# Patient Record
Sex: Female | Born: 1955 | Race: White | Hispanic: No | Marital: Married | Smoking: Never smoker
Health system: Southern US, Community
[De-identification: ages and names within clinical notes are randomized; demographics above are authoritative.]

## PROBLEM LIST (undated history)

## (undated) DIAGNOSIS — E039 Hypothyroidism, unspecified: Secondary | ICD-10-CM

## (undated) DIAGNOSIS — M1990 Unspecified osteoarthritis, unspecified site: Secondary | ICD-10-CM

## (undated) DIAGNOSIS — R011 Cardiac murmur, unspecified: Secondary | ICD-10-CM

## (undated) DIAGNOSIS — I10 Essential (primary) hypertension: Secondary | ICD-10-CM

## (undated) HISTORY — PX: TONSILLECTOMY: SUR1361

## (undated) HISTORY — PX: APPENDECTOMY: SHX54

---

## 2002-12-30 ENCOUNTER — Ambulatory Visit (HOSPITAL_COMMUNITY): Admission: RE | Admit: 2002-12-30 | Discharge: 2002-12-31 | Payer: Self-pay | Admitting: Specialist

## 2002-12-30 ENCOUNTER — Encounter: Payer: Self-pay | Admitting: Specialist

## 2004-03-18 HISTORY — PX: BACK SURGERY: SHX140

## 2009-05-04 ENCOUNTER — Ambulatory Visit: Payer: Self-pay | Admitting: Diagnostic Radiology

## 2009-05-04 ENCOUNTER — Emergency Department (HOSPITAL_BASED_OUTPATIENT_CLINIC_OR_DEPARTMENT_OTHER): Admission: EM | Admit: 2009-05-04 | Discharge: 2009-05-04 | Payer: Self-pay | Admitting: Emergency Medicine

## 2010-04-08 ENCOUNTER — Encounter: Payer: Self-pay | Admitting: Orthopedic Surgery

## 2010-04-09 ENCOUNTER — Encounter: Payer: Self-pay | Admitting: Family Medicine

## 2010-06-07 LAB — CBC
HCT: 34.9 % — ABNORMAL LOW (ref 36.0–46.0)
Hemoglobin: 11.5 g/dL — ABNORMAL LOW (ref 12.0–15.0)
MCHC: 33.1 g/dL (ref 30.0–36.0)
MCV: 83.5 fL (ref 78.0–100.0)
Platelets: 231 10*3/uL (ref 150–400)
RBC: 4.17 MIL/uL (ref 3.87–5.11)
RDW: 15 % (ref 11.5–15.5)
WBC: 7 10*3/uL (ref 4.0–10.5)

## 2010-06-07 LAB — POCT CARDIAC MARKERS
CKMB, poc: <1 ng/mL — ABNORMAL LOW (ref 1.0–8.0)
Troponin i, poc: <0.05 ng/mL (ref 0.00–0.09)

## 2010-06-07 LAB — BASIC METABOLIC PANEL
BUN: 14 mg/dL (ref 6–23)
CO2: 28 mEq/L (ref 19–32)
Calcium: 8.8 mg/dL (ref 8.4–10.5)
Chloride: 107 mEq/L (ref 96–112)
Creatinine, Ser: 0.6 mg/dL (ref 0.4–1.2)
GFR calc Af Amer: >60 mL/min (ref >60)
GFR calc non Af Amer: >60 mL/min (ref >60)
Glucose, Bld: 86 mg/dL (ref 70–99)
Potassium: 3.6 mEq/L (ref 3.5–5.1)
Sodium: 140 mEq/L (ref 135–145)

## 2010-06-07 LAB — DIFFERENTIAL
Basophils Absolute: 0.1 10*3/uL (ref 0.0–0.1)
Lymphocytes Relative: 30 % (ref 12–46)
Lymphs Abs: 2.1 10*3/uL (ref 0.7–4.0)
Monocytes Absolute: 0.5 10*3/uL (ref 0.1–1.0)
Neutro Abs: 4 10*3/uL (ref 1.7–7.7)

## 2010-08-03 NOTE — Op Note (Signed)
NAME:  Kaitlyn Melton, Kaitlyn Melton                      ACCOUNT NO.:  [id number]   MEDICAL RECORD NO.:  [id number]                   PATIENT TYPE:  OIB   LOCATION:  5004                                 FACILITY:  [facility]   PHYSICIAN:  Jene Every, M.D.                 DATE OF BIRTH:  06/15/1955   DATE OF PROCEDURE:  12/30/2002  DATE OF DISCHARGE:                                 OPERATIVE REPORT   PREOPERATIVE DIAGNOSIS:  Lateral recess stenosis, herniated nucleus  pulposis, L4-L5 right.   POSTOPERATIVE DIAGNOSIS:  Lateral recess stenosis, herniated nucleus  pulposis, L4-L5 right.   PROCEDURE PERFORMED:  Lateral recess decompression and foraminotomy L5,  microdiscectomy L4-L5.   ANESTHESIA:  General.   ASSISTANT:  Genene Churn. Barry Dienes, P.A.   BRIEF HISTORY AND INDICATIONS:  This is a 55 year old with refractory L5  radicular pain, foot drop, decreased sensation at the L5 dermatome.  MRI  indicated extruded fragment compressing the L5 nerve root.  Operative  intervention was indication for decompression of the L5 nerve root by  microdiscectomy, lateral recess stenosis.  The risks and benefits were  discussed including bleeding, infection, CSF leak, epidural fibrosis, etc.   SURGICAL TECHNIQUE:  With the patient in the supine position, after adequate  general anesthesia and 1 gram Kefzol, she was placed prone on the Hi-Nella  frame.  All bony prominences were well padded.  The lumbar region was  prepped and draped in the usual sterile fashion.  Two 18 gauge spinal  needles were utilized to localize the L4-L5 interspace confirmed with x-ray  and incision was made from the spinous process of 4 to 5.  The subcutaneous  tissue were dissected, electrocautery was utilized to achieve hemostasis.  The dorsal lumbar fascia was identified and divided in line with the skin  incision.  The paraspinous muscle was elevated from 4 and 5.  A Scoville  retractor was placed after a confirmatory radiograph was  obtained.  The  operating microscope was draped and brought into the surgical field.  The  ligamentum flavum was detached from the cephalad edge of L5 and the caudal  edge of L4.  Hemilaminotomy was performed of the caudad edge of 4 and  cephalad edge of 5.  There was significant lateral recess stenosis noted and  ligamentum flavum hypertrophy.  Protecting the neural elements, we  decompressed the lateral recess with a 2 and 3 mm Kerrisons and performed a  foraminotomy of L5, identified the L5 nerve root, gently mobilized it  medially.  A large extruded fragment was noted just at the level of the L5  pedicle medially.  It was hypervascular and bipolar electrocautery was  utilized to achieve hemostasis.  We mobilized a herniated disc and removed  it in one large fragment.  I placed a hockey stick probe beneath the thecal  sac in the foramen of 5 and 4 and found it widely patent with no  residual  disc herniation.  I checked at the disc space, too, there was no evidence of  disc herniation noted at this area.  There was good excursion of the 5 root.  Bipolar cautery was utilized to achieve hemostasis.  The hemilaminotomy was  copiously irrigated with antibiotic irrigation and placed thrombin soaked  Gelfoam.  There was no CSF leakage or active bleeding.  Removing the  Scoville retractor, the paraspinous muscles were inspected with no evidence  of active bleeding.  The dorsal lumbar fascia was reapproximated with 0  Vicryl interrupted figure-of-eight sutures, the subcutaneous tissues were  reapproximated with 2-0 Vicryl simple sutures, the  skin was reapproximated with 4-0 subcuticular Prolene.  The wounds were  reinforced with Steri-Strips.  A sterile dressing was applied.  She was  placed supine and extubated without difficulty and transported to the  recovery room in satisfactory condition.  The patient tolerated the  procedure well with no complications.                                                Jene Every, M.D.    Cordelia Pen  D:  12/30/2002  T:  12/30/2002  Job:  [id number]

## 2010-11-16 IMAGING — CR DG CHEST 2V
2 series · 2 of 2 positions shown · non-contrast
Comparison: None.

CLINICAL DATA: Left chest pain.  Left arm tingling sensation.
Hypertension.

CHEST - 2 VIEW

[w chest pa]
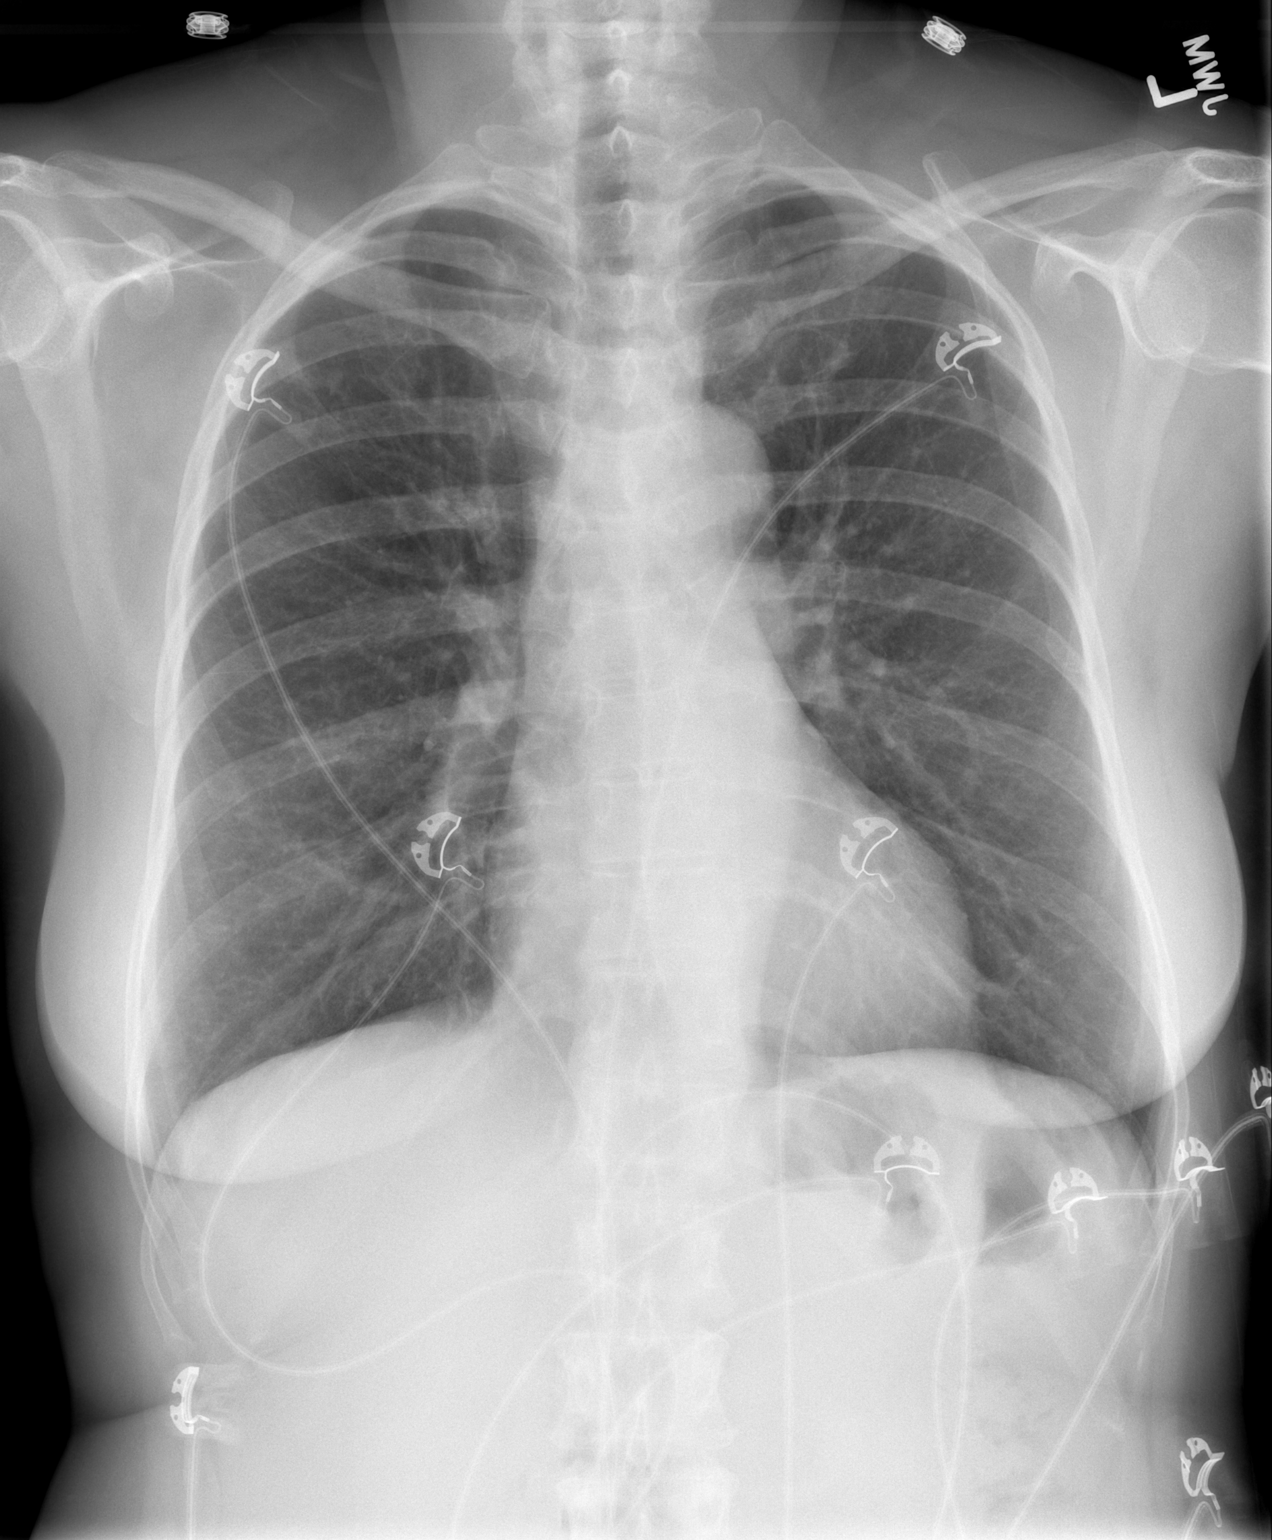

[w chest lat]
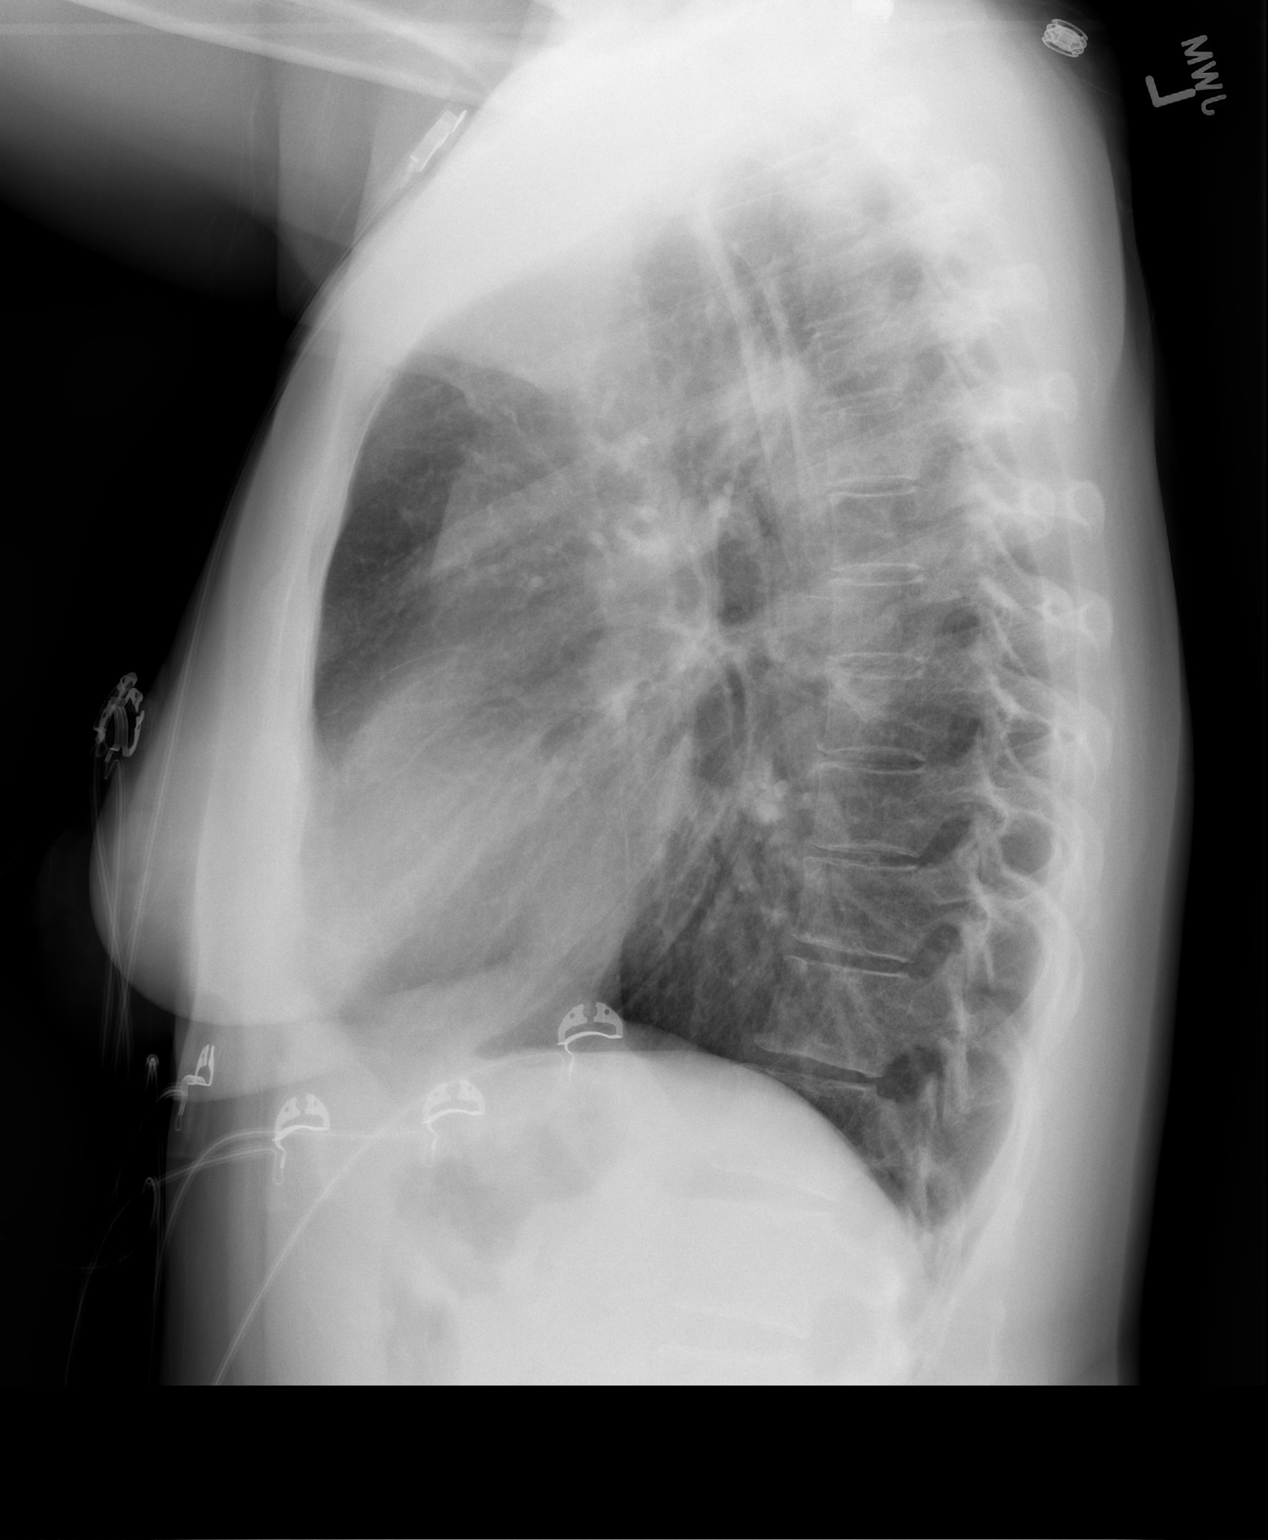

[2 of 2 positions shown; findings below may reference images not displayed]

FINDINGS: Minimal interstitial prominence the lungs may be chronic.
No airspace opacity is identified.  There is borderline airway
thickening which may be manifestation of bronchitis or reactive
airways disease.

Cardiac and mediastinal contours appear unremarkable.
IMPRESSION: 1.  Borderline airway thickening, potentially manifestation of
bronchitis or reactive airways disease.
2.  Mild prominence of the interstitium, of uncertain chronicity.

## 2012-12-16 HISTORY — PX: CARDIAC VALVE SURGERY: SHX40

## 2013-04-30 ENCOUNTER — Encounter: Payer: Self-pay | Admitting: Internal Medicine

## 2013-07-01 ENCOUNTER — Encounter: Payer: Self-pay | Admitting: Internal Medicine

## 2015-09-14 NOTE — H&P (Signed)
TOTAL KNEE ADMISSION H&P  Patient is being admitted for left total knee arthroplasty.  Subjective:  Chief Complaint:   Left knee primary OA / pain  HPI: Kaitlyn Melton, 60 y.o. female, has a history of pain and functional disability in the left knee due to arthritis and has failed non-surgical conservative treatments for greater than 12 weeks to include NSAID's and/or analgesics, corticosteriod injections, viscosupplementation injections and activity modification.  Onset of symptoms was gradual, starting 3+ years ago with gradually worsening course since that time. The patient noted no past surgery on the left knee(s).  Patient currently rates pain in the left knee(s) at 10 out of 10 with activity. Patient has night pain, worsening of pain with activity and weight bearing, pain that interferes with activities of daily living, pain with passive range of motion, crepitus and joint swelling.  Patient has evidence of periarticular osteophytes and joint space narrowing by imaging studies. There is no active infection.  Risks, benefits and expectations were discussed with the patient.  Risks including but not limited to the risk of anesthesia, blood clots, nerve damage, blood vessel damage, failure of the prosthesis, infection and up to and including death.  Patient understand the risks, benefits and expectations and wishes to proceed with surgery.   PCP: Teena IraniSPENCER,SARA C, PA-C  D/C Plans:      Home with HHPT  Post-op Meds:       No Rx given  Tranexamic Acid:      To be given - IV   Decadron:      Is to be given  FYI:     ASA  Norco     Past Medical History  Diagnosis Date  . Hypertension   . Arthritis   . Heart murmur   . Hypothyroidism     Past Surgical History  Procedure Laterality Date  . 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 Tonsillectomy      age 89  . Appendectomy      age 60  . Back surgery  2006    lumbar surgery    No prescriptions prior to  admission   No Known Allergies   Social History  Substance Use Topics  . Smoking status: Never Smoker   . Smokeless tobacco: Not on file  . Alcohol Use: No       Review of Systems  Constitutional: Negative.   HENT: Negative.   Eyes: Negative.   Respiratory: Negative.   Cardiovascular: Negative.   Gastrointestinal: Negative.   Genitourinary: Negative.   Musculoskeletal: Positive for joint pain.  Skin: Negative.   Neurological: Negative.   Endo/Heme/Allergies: Negative.   Psychiatric/Behavioral: Negative.     Objective:  Physical Exam  Constitutional: She is oriented to person, place, and time. She appears well-developed.  HENT:  Head: Normocephalic.  Eyes: Pupils are equal, round, and reactive to light.  Neck: Neck supple. No JVD present. No tracheal deviation present. No thyromegaly present.  Cardiovascular: Normal rate, regular rhythm and intact distal pulses.   Murmur heard. Respiratory: Effort normal and breath sounds normal. No stridor. No respiratory distress. She has no wheezes.  GI: Soft. There is no tenderness. There is no guarding.  Musculoskeletal:       Left knee: She exhibits decreased range of motion, swelling and bony tenderness. She exhibits no ecchymosis, no deformity, no laceration and no erythema. Tenderness found.  Lymphadenopathy:    She has no cervical adenopathy.  Neurological: She is alert  and oriented to person, place, and time.  Skin: Skin is warm and dry.  Psychiatric: She has a normal mood and affect.      Imaging Review Plain radiographs demonstrate severe degenerative joint disease of the left knee(s).  The bone quality appears to be good for age and reported activity level.  Assessment/Plan:  End stage arthritis, left knee   The patient history, physical examination, clinical judgment of the provider and imaging studies are consistent with end stage degenerative joint disease of the left knee(s) and total knee arthroplasty is  deemed medically necessary. The treatment options including medical management, injection therapy arthroscopy and arthroplasty were discussed at length. The risks and benefits of total knee arthroplasty were presented and reviewed. The risks due to aseptic loosening, infection, stiffness, patella tracking problems, thromboembolic complications and other imponderables were discussed. The patient acknowledged the explanation, agreed to proceed with the plan and consent was signed. Patient is being admitted for inpatient treatment for surgery, pain control, PT, OT, prophylactic antibiotics, VTE prophylaxis, progressive ambulation and ADL's and discharge planning. The patient is planning to be discharged home with home health services.     Anastasio AuerbachMatthew S. Costella Schwarz   PA-C  09/24/2015, 9:43 AM

## 2015-09-20 ENCOUNTER — Encounter (HOSPITAL_COMMUNITY): Payer: Self-pay

## 2015-09-20 ENCOUNTER — Encounter (HOSPITAL_COMMUNITY)
Admission: RE | Admit: 2015-09-20 | Discharge: 2015-09-20 | Disposition: A | Discharge status: Home / Self Care | Payer: BLUE CROSS/BLUE SHIELD | Source: Ambulatory Visit | Attending: Orthopedic Surgery | Admitting: Orthopedic Surgery

## 2015-09-20 DIAGNOSIS — Z01812 Encounter for preprocedural laboratory examination: Secondary | ICD-10-CM | POA: Diagnosis not present

## 2015-09-20 DIAGNOSIS — M25562 Pain in left knee: Secondary | ICD-10-CM | POA: Diagnosis not present

## 2015-09-20 DIAGNOSIS — M1712 Unilateral primary osteoarthritis, left knee: Secondary | ICD-10-CM | POA: Diagnosis not present

## 2015-09-20 HISTORY — DX: Cardiac murmur, unspecified: R01.1

## 2015-09-20 HISTORY — DX: Unspecified osteoarthritis, unspecified site: M19.90

## 2015-09-20 HISTORY — DX: Essential (primary) hypertension: I10

## 2015-09-20 HISTORY — DX: Hypothyroidism, unspecified: E03.9

## 2015-09-20 LAB — CBC
HEMATOCRIT: 40.4 % (ref 36.0–46.0)
HEMOGLOBIN: 13.6 g/dL (ref 12.0–15.0)
MCH: 30 pg (ref 26.0–34.0)
MCHC: 33.7 g/dL (ref 30.0–36.0)
MCV: 89.2 fL (ref 78.0–100.0)
Platelets: 183 10*3/uL (ref 150–400)
RBC: 4.53 MIL/uL (ref 3.87–5.11)
RDW: 13.5 % (ref 11.5–15.5)
WBC: 6.4 10*3/uL (ref 4.0–10.5)

## 2015-09-20 LAB — BASIC METABOLIC PANEL
ANION GAP: 5 (ref 5–15)
BUN: 21 mg/dL — ABNORMAL HIGH (ref 6–20)
CHLORIDE: 107 mmol/L (ref 101–111)
CO2: 24 mmol/L (ref 22–32)
Calcium: 9 mg/dL (ref 8.9–10.3)
Creatinine, Ser: 0.74 mg/dL (ref 0.44–1.00)
Glucose, Bld: 92 mg/dL (ref 65–99)
POTASSIUM: 4.4 mmol/L (ref 3.5–5.1)
SODIUM: 136 mmol/L (ref 135–145)

## 2015-09-20 LAB — SURGICAL PCR SCREEN
MRSA, PCR: NEGATIVE
Staphylococcus aureus: NEGATIVE

## 2015-09-20 LAB — ABO/RH: ABO/RH(D): O POS

## 2015-09-20 NOTE — Patient Instructions (Signed)
Leonette NuttingFrances E Gelinas  09/20/2015   Your procedure is scheduled on: Monday 09/25/2015  [facility]  [place]vators to 3rd floor to  Short Stay Center at  0730 AM.  Call this number if you have problems the morning of surgery [phone]   Remember: ONLY 1 PERSON MAY GO WITH YOU TO SHORT STAY TO GET  READY MORNING OF YOUR SURGERY.   Do not eat food or drink liquids :After Midnight.     Take these medicines the morning of surgery with A SIP OF WATER: Metoprolol succinate, Levothyroxine                                 You may not have any metal on your body including hair pins and              piercings  Do not wear jewelry, make-up, lotions, powders or perfumes, deodorant             Do not wear nail polish.  Do not shave  48 hours prior to surgery.              Men may shave face and neck.   Do not bring valuables to the hospital. [facility] IS NOT             RESPONSIBLE   FOR VALUABLES.  Contacts, dentures or bridgework may not be worn into surgery.  Leave suitcase in the car. After surgery it may be brought to your room.                  Please read over the following fact sheets you were given: _____________________________________________________________________             [facility] - Pre[facility] Before surgery, you can play an important role.  Because skin is not sterile, your skin needs to be as free of germs as possible.  You can reduce the number of germs on your skin by washing with CHG (chlorahexidine gluconate) soap before surgery.  CHG is an antiseptic cleaner which kills germs and bonds with the skin to continue killing germs even after washing. Please DO NOT use if you have an allergy to CHG or antibacterial soaps.  If your skin becomes reddened/irritated stop using the CHG and inform your nurse when you arrive at Short Stay. Do not shave (including legs and underarms) for at least 48 hours prior to  the first CHG shower.  You may shave your face/neck. Please follow these instructions carefully:  1.  Shower with CHG Soap the night before surgery and the  morning of Surgery.  2.  If you choose to wash your hair, wash your hair first as usual with your  normal  shampoo.  3.  After you shampoo, rinse your hair and body thoroughly to remove the  shampoo.                           4.  Use CHG as you would any other liquid soap.  You can apply chg directly  to the skin and wash                       Gently with a scrungie or clean washcloth.  5.  Apply the CHG Soap to your body ONLY FROM THE NECK DOWN.   Do not use on face/ open                           Wound or open sores. Avoid contact with eyes, ears mouth and genitals (private parts).                       Wash face,  Genitals (private parts) with your normal soap.             6.  Wash thoroughly, paying special attention to the area where your surgery  will be performed.  7.  Thoroughly rinse your body with warm water from the neck down.  8.  DO NOT shower/wash with your normal soap after using and rinsing off  the CHG Soap.                9.  Pat yourself dry with a clean towel.            10.  Wear clean pajamas.            11.  Place clean sheets on your bed the night of your first shower and do not  sleep with pets. Day of Surgery : Do not apply any lotions/deodorants the morning of surgery.  Please wear clean clothes to the hospital/surgery center.  FAILURE TO FOLLOW THESE INSTRUCTIONS MAY RESULT IN THE CANCELLATION OF YOUR SURGERY PATIENT SIGNATURE_________________________________  NURSE SIGNATURE__________________________________  ________________________________________________________________________   Adam Phenix  An incentive spirometer is a tool that can help keep your lungs clear and active. This tool measures how well you are filling your lungs with each breath. Taking long deep breaths may help reverse or  decrease the chance of developing breathing (pulmonary) problems (especially infection) following:  A long period of time when you are unable to move or be active. BEFORE THE PROCEDURE   If the spirometer includes an indicator to show your best effort, your nurse or respiratory therapist will set it to a desired goal.  If possible, sit up straight or lean slightly forward. Try not to slouch.  Hold the incentive spirometer in an upright position. INSTRUCTIONS FOR USE   Sit on the edge of your bed if possible, or sit up as far as you can in bed or on a chair.  Hold the incentive spirometer in an upright position.  Breathe out normally.  Place the mouthpiece in your mouth and seal your lips tightly around it.  Breathe in slowly and as deeply as possible, raising the piston or the ball toward the top of the column.  Hold your breath for 3-5 seconds or for as long as possible. Allow the piston or ball to fall to the bottom of the column.  Remove the mouthpiece from your mouth and breathe out normally.  Rest for a few seconds and repeat Steps 1 through 7 at least 10 times every 1-2 hours when you are awake. Take your time and take a few normal breaths between deep breaths.  The spirometer may include an indicator to show your best effort. Use the indicator as a goal to work toward during each repetition.  After each set of 10 deep breaths, practice coughing to be sure your lungs are clear. If you have an incision (the cut made at the time of surgery), support your incision when coughing by placing a  pillow or rolled up towels firmly against it. Once you are able to get out of bed, walk around indoors and cough well. You may stop using the incentive spirometer when instructed by your caregiver.  RISKS AND COMPLICATIONS  Take your time so you do not get dizzy or light-headed.  If you are in pain, you may need to take or ask for pain medication before doing incentive spirometry. It is  harder to take a deep breath if you are having pain. AFTER USE  Rest and breathe slowly and easily.  It can be helpful to keep track of a log of your progress. Your caregiver can provide you with a simple table to help with this. If you are using the spirometer at home, follow these instructions: [place] IF:   You are having difficultly using the spirometer.  You have trouble using the spirometer as often as instructed.  Your pain medication is not giving enough relief while using the spirometer.  You develop fever of 100.5 F (38.1 C) or higher. SEEK IMMEDIATE MEDICAL CARE IF:   You cough up bloody sputum that had not been present before.  You develop fever of 102 F (38.9 C) or greater.  You develop worsening pain at or near the incision site. MAKE SURE YOU:   Understand these instructions.  Will watch your condition.  Will get help right away if you are not doing well or get worse. Document Released: 07/15/2006 Document Revised: 05/27/2011 Document Reviewed: 09/15/2006 Exit[employer] Patient Information 2014 [employer], [place].   ________________________________________________________________________  WHAT IS A BLOOD TRANSFUSION? Blood Transfusion Information  A transfusion is the replacement of blood or some of its parts. Blood is made up of multiple cells which provide different functions.  Red blood cells carry oxygen and are used for blood loss replacement.  White blood cells fight against infection.  Platelets control bleeding.  Plasma helps clot blood.  Other blood products are available for specialized needs, such as hemophilia or other clotting disorders. BEFORE THE TRANSFUSION  Who gives blood for transfusions?   Healthy volunteers who are fully evaluated to make sure their blood is safe. This is blood bank blood. Transfusion therapy is the safest it has ever been in the practice of medicine. Before blood is taken from a donor, a complete history  is taken to make sure that person has no history of diseases nor engages in risky social behavior (examples are intravenous drug use or sexual activity with multiple partners). The donor's travel history is screened to minimize risk of transmitting infections, such as malaria. The donated blood is tested for signs of infectious diseases, such as HIV and hepatitis. The blood is then tested to be sure it is compatible with you in order to minimize the chance of a transfusion reaction. If you or a relative donates blood, this is often done in anticipation of surgery and is not appropriate for emergency situations. It takes many days to process the donated blood. RISKS AND COMPLICATIONS Although transfusion therapy is very safe and saves many lives, the main dangers of transfusion include:   Getting an infectious disease.  Developing a transfusion reaction. This is an allergic reaction to something in the blood you were given. Every precaution is taken to prevent this. The decision to have a blood transfusion has been considered carefully by your caregiver before blood is given. Blood is not given unless the benefits outweigh the risks. AFTER THE TRANSFUSION  Right after receiving a blood transfusion, you  will usually feel much better and more energetic. This is especially true if your red blood cells have gotten low (anemic). The transfusion raises the level of the red blood cells which carry oxygen, and this usually causes an energy increase.  The nurse administering the transfusion will monitor you carefully for complications. HOME CARE INSTRUCTIONS  No special instructions are needed after a transfusion. You may find your energy is better. Speak with your caregiver about any limitations on activity for underlying diseases you may have. SEEK MEDICAL CARE IF:   Your condition is not improving after your transfusion.  You develop redness or irritation at the intravenous (IV) site. SEEK IMMEDIATE  MEDICAL CARE IF:  Any of the following symptoms occur over the next 12 hours:  Shaking chills.  You have a temperature by mouth above 102 F (38.9 C), not controlled by medicine.  Chest, back, or muscle pain.  People around you feel you are not acting correctly or are confused.  Shortness of breath or difficulty breathing.  Dizziness and fainting.  You get a rash or develop hives.  You have a decrease in urine output.  Your urine turns a dark color or changes to pink, red, or brown. Any of the following symptoms occur over the next 10 days:  You have a temperature by mouth above 102 F (38.9 C), not controlled by medicine.  Shortness of breath.  Weakness after normal activity.  The white part of the eye turns yellow (jaundice).  You have a decrease in the amount of urine or are urinating less often.  Your urine turns a dark color or changes to pink, red, or brown. Document Released: 03/01/2000 Document Revised: 05/27/2011 Document Reviewed: 10/19/2007 [facility] Patient Information 2014 [place], [place].  _______________________________________________________________________

## 2015-09-20 NOTE — Progress Notes (Signed)
08[facility]/15-[facility] Georgette Shell(Sara Spencer ) on chart and EKG . 09/04/2015- Pre-operative clearance from Dr. Vilinda BoehringerGary Renaldo on chart. 11/08/2012- [facility] ( Dr. Katrina Stackarol Womble) on chart.

## 2015-09-25 ENCOUNTER — Inpatient Hospital Stay (HOSPITAL_COMMUNITY)
Admission: RE | Admit: 2015-09-25 | Discharge: 2015-09-26 | DRG: 470 | Disposition: A | Discharge status: Home Health | Payer: BLUE CROSS/BLUE SHIELD | Source: Ambulatory Visit | Attending: Orthopedic Surgery | Admitting: Orthopedic Surgery

## 2015-09-25 ENCOUNTER — Encounter (HOSPITAL_COMMUNITY)
Admission: RE | Disposition: A | Discharge status: Home Health | Payer: Self-pay | Source: Ambulatory Visit | Attending: Orthopedic Surgery

## 2015-09-25 ENCOUNTER — Inpatient Hospital Stay (HOSPITAL_COMMUNITY): Payer: BLUE CROSS/BLUE SHIELD | Admitting: Anesthesiology

## 2015-09-25 ENCOUNTER — Encounter (HOSPITAL_COMMUNITY): Payer: Self-pay | Admitting: *Deleted

## 2015-09-25 DIAGNOSIS — M25562 Pain in left knee: Secondary | ICD-10-CM | POA: Diagnosis present

## 2015-09-25 DIAGNOSIS — I10 Essential (primary) hypertension: Secondary | ICD-10-CM | POA: Diagnosis present

## 2015-09-25 DIAGNOSIS — E669 Obesity, unspecified: Secondary | ICD-10-CM | POA: Diagnosis present

## 2015-09-25 DIAGNOSIS — M1712 Unilateral primary osteoarthritis, left knee: Principal | ICD-10-CM | POA: Diagnosis present

## 2015-09-25 DIAGNOSIS — Z96652 Presence of left artificial knee joint: Secondary | ICD-10-CM

## 2015-09-25 DIAGNOSIS — Z952 Presence of prosthetic heart valve: Secondary | ICD-10-CM

## 2015-09-25 DIAGNOSIS — M659 Synovitis and tenosynovitis, unspecified: Secondary | ICD-10-CM | POA: Diagnosis present

## 2015-09-25 DIAGNOSIS — Z96659 Presence of unspecified artificial knee joint: Secondary | ICD-10-CM

## 2015-09-25 DIAGNOSIS — Z6830 Body mass index (BMI) 30.0-30.9, adult: Secondary | ICD-10-CM | POA: Diagnosis not present

## 2015-09-25 DIAGNOSIS — E039 Hypothyroidism, unspecified: Secondary | ICD-10-CM | POA: Diagnosis present

## 2015-09-25 HISTORY — PX: TOTAL KNEE ARTHROPLASTY: SHX125

## 2015-09-25 LAB — TYPE AND SCREEN
ABO/RH(D): O POS
ANTIBODY SCREEN: NEGATIVE

## 2015-09-25 SURGERY — ARTHROPLASTY, KNEE, TOTAL
Anesthesia: Spinal | Site: Knee | Laterality: Left

## 2015-09-25 MED ORDER — CELECOXIB 200 MG PO CAPS
200.0000 mg | ORAL_CAPSULE | Freq: Two times a day (BID) | ORAL | Status: DC
Start: 2015-09-25 — End: 2015-09-26
  Administered 2015-09-25 – 2015-09-26 (×2): 200 mg via ORAL
  Filled 2015-09-25 (×2): qty 1

## 2015-09-25 MED ORDER — ONDANSETRON HCL 4 MG/2ML IJ SOLN: 4.0000 mg | Freq: Four times a day (QID) | INTRAMUSCULAR | Status: DC | PRN

## 2015-09-25 MED ORDER — FENTANYL CITRATE (PF) 100 MCG/2ML IJ SOLN
INTRAMUSCULAR | Status: AC
  Filled 2015-09-25: qty 2

## 2015-09-25 MED ORDER — SODIUM CHLORIDE 0.9 % IV SOLN
INTRAVENOUS | Status: DC
  Administered 2015-09-25 – 2015-09-26 (×2): via INTRAVENOUS

## 2015-09-25 MED ORDER — 0.9 % SODIUM CHLORIDE (POUR BTL) OPTIME
TOPICAL | Status: DC | PRN
Start: 2015-09-25 — End: 2015-09-25
  Administered 2015-09-25: 1000 mL

## 2015-09-25 MED ORDER — DEXAMETHASONE SODIUM PHOSPHATE 10 MG/ML IJ SOLN
10.0000 mg | Freq: Once | INTRAMUSCULAR | Status: AC
  Administered 2015-09-25: 10 mg via INTRAVENOUS

## 2015-09-25 MED ORDER — METOPROLOL SUCCINATE ER 50 MG PO TB24
50.0000 mg | ORAL_TABLET | Freq: Every day | ORAL | Status: DC
  Administered 2015-09-26: 50 mg via ORAL
  Filled 2015-09-25: qty 1

## 2015-09-25 MED ORDER — PROPOFOL 10 MG/ML IV BOLUS
INTRAVENOUS | Status: AC
  Filled 2015-09-25: qty 20

## 2015-09-25 MED ORDER — BISACODYL 10 MG RE SUPP: 10.0000 mg | Freq: Every day | RECTAL | Status: DC | PRN

## 2015-09-25 MED ORDER — FERROUS SULFATE 325 (65 FE) MG PO TABS
325.0000 mg | ORAL_TABLET | Freq: Three times a day (TID) | ORAL | Status: DC
  Administered 2015-09-25 – 2015-09-26 (×2): 325 mg via ORAL
  Filled 2015-09-25 (×2): qty 1

## 2015-09-25 MED ORDER — MAGNESIUM CITRATE PO SOLN: 1.0000 | Freq: Once | ORAL | Status: DC | PRN

## 2015-09-25 MED ORDER — BUPIVACAINE-EPINEPHRINE (PF) 0.25% -1:200000 IJ SOLN
INTRAMUSCULAR | Status: DC | PRN
Start: 2015-09-25 — End: 2015-09-25
  Administered 2015-09-25: 30 mL

## 2015-09-25 MED ORDER — LEVOTHYROXINE SODIUM 100 MCG PO TABS
100.0000 ug | ORAL_TABLET | Freq: Every day | ORAL | Status: DC
  Administered 2015-09-26: 100 ug via ORAL
  Filled 2015-09-25: qty 1

## 2015-09-25 MED ORDER — METOCLOPRAMIDE HCL 5 MG/ML IJ SOLN: 5.0000 mg | Freq: Three times a day (TID) | INTRAMUSCULAR | Status: DC | PRN

## 2015-09-25 MED ORDER — LACTATED RINGERS IV SOLN
INTRAVENOUS | Status: DC
  Administered 2015-09-25: 11:00:00 via INTRAVENOUS
  Administered 2015-09-25: 1000 mL via INTRAVENOUS
  Administered 2015-09-25: 11:00:00 via INTRAVENOUS

## 2015-09-25 MED ORDER — KETOROLAC TROMETHAMINE 30 MG/ML IJ SOLN
INTRAMUSCULAR | Status: AC
Start: 2015-09-25 — End: 2015-09-25
  Filled 2015-09-25: qty 1

## 2015-09-25 MED ORDER — DEXAMETHASONE SODIUM PHOSPHATE 10 MG/ML IJ SOLN
INTRAMUSCULAR | Status: AC
  Filled 2015-09-25: qty 1

## 2015-09-25 MED ORDER — BUPIVACAINE HCL (PF) 0.25 % IJ SOLN
INTRAMUSCULAR | Status: AC
Start: 2015-09-25 — End: 2015-09-25
  Filled 2015-09-25: qty 30

## 2015-09-25 MED ORDER — ONDANSETRON HCL 4 MG PO TABS: 4.0000 mg | ORAL_TABLET | Freq: Four times a day (QID) | ORAL | Status: DC | PRN

## 2015-09-25 MED ORDER — PHENOL 1.4 % MT LIQD: 1.0000 | OROMUCOSAL | Status: DC | PRN

## 2015-09-25 MED ORDER — MIDAZOLAM HCL 5 MG/5ML IJ SOLN
INTRAMUSCULAR | Status: DC | PRN
  Administered 2015-09-25: 2 mg via INTRAVENOUS

## 2015-09-25 MED ORDER — LIDOCAINE HCL (CARDIAC) 20 MG/ML IV SOLN
INTRAVENOUS | Status: DC | PRN
  Administered 2015-09-25: 50 mg via INTRAVENOUS

## 2015-09-25 MED ORDER — HYDROCODONE-ACETAMINOPHEN 7.5-325 MG PO TABS
1.0000 | ORAL_TABLET | ORAL | Status: DC
  Administered 2015-09-25: 2 via ORAL
  Administered 2015-09-25: 1 via ORAL
  Administered 2015-09-25 – 2015-09-26 (×3): 2 via ORAL
  Filled 2015-09-25 (×4): qty 2
  Filled 2015-09-25: qty 1

## 2015-09-25 MED ORDER — MIDAZOLAM HCL 2 MG/2ML IJ SOLN
INTRAMUSCULAR | Status: AC
  Filled 2015-09-25: qty 2

## 2015-09-25 MED ORDER — BUPIVACAINE IN DEXTROSE 0.75-8.25 % IT SOLN
INTRATHECAL | Status: DC | PRN
Start: 2015-09-25 — End: 2015-09-25
  Administered 2015-09-25: 1.6 mL via INTRATHECAL

## 2015-09-25 MED ORDER — METHOCARBAMOL 1000 MG/10ML IJ SOLN
500.0000 mg | Freq: Four times a day (QID) | INTRAVENOUS | Status: DC | PRN
  Administered 2015-09-25: 500 mg via INTRAVENOUS
  Filled 2015-09-25: qty 550
  Filled 2015-09-25: qty 5

## 2015-09-25 MED ORDER — HYDROMORPHONE HCL 1 MG/ML IJ SOLN: 0.2500 mg | INTRAMUSCULAR | Status: DC | PRN

## 2015-09-25 MED ORDER — ONDANSETRON HCL 4 MG/2ML IJ SOLN
INTRAMUSCULAR | Status: DC | PRN
  Administered 2015-09-25: 4 mg via INTRAVENOUS

## 2015-09-25 MED ORDER — BUPIVACAINE-EPINEPHRINE 0.25% -1:200000 IJ SOLN
INTRAMUSCULAR | Status: AC
  Filled 2015-09-25: qty 1

## 2015-09-25 MED ORDER — ASPIRIN EC 325 MG PO TBEC
325.0000 mg | DELAYED_RELEASE_TABLET | Freq: Two times a day (BID) | ORAL | Status: DC
  Administered 2015-09-26: 325 mg via ORAL
  Filled 2015-09-25: qty 1

## 2015-09-25 MED ORDER — ONDANSETRON HCL 4 MG/2ML IJ SOLN
INTRAMUSCULAR | Status: AC
  Filled 2015-09-25: qty 2

## 2015-09-25 MED ORDER — CEFAZOLIN SODIUM-DEXTROSE 2-4 GM/100ML-% IV SOLN
INTRAVENOUS | Status: AC
  Filled 2015-09-25: qty 100

## 2015-09-25 MED ORDER — KETOROLAC TROMETHAMINE 30 MG/ML IJ SOLN
INTRAMUSCULAR | Status: DC | PRN
  Administered 2015-09-25: 30 mg

## 2015-09-25 MED ORDER — SODIUM CHLORIDE 0.9 % IJ SOLN
INTRAMUSCULAR | Status: AC
Start: 2015-09-25 — End: 2015-09-25
  Filled 2015-09-25: qty 50

## 2015-09-25 MED ORDER — HYDROMORPHONE HCL 1 MG/ML IJ SOLN
0.5000 mg | INTRAMUSCULAR | Status: DC | PRN
Start: 2015-09-25 — End: 2015-09-26
  Administered 2015-09-25 – 2015-09-26 (×2): 1 mg via INTRAVENOUS
  Filled 2015-09-25 (×2): qty 1

## 2015-09-25 MED ORDER — METOCLOPRAMIDE HCL 5 MG PO TABS: 5.0000 mg | ORAL_TABLET | Freq: Three times a day (TID) | ORAL | Status: DC | PRN

## 2015-09-25 MED ORDER — FENTANYL CITRATE (PF) 100 MCG/2ML IJ SOLN: 25.0000 ug | INTRAMUSCULAR | Status: DC | PRN

## 2015-09-25 MED ORDER — SODIUM CHLORIDE 0.9 % IJ SOLN
INTRAMUSCULAR | Status: DC | PRN
  Administered 2015-09-25: 30 mL

## 2015-09-25 MED ORDER — DIPHENHYDRAMINE HCL 25 MG PO CAPS: 25.0000 mg | ORAL_CAPSULE | Freq: Four times a day (QID) | ORAL | Status: DC | PRN

## 2015-09-25 MED ORDER — PROMETHAZINE HCL 25 MG/ML IJ SOLN: 6.2500 mg | INTRAMUSCULAR | Status: DC | PRN

## 2015-09-25 MED ORDER — CEFAZOLIN SODIUM-DEXTROSE 2-4 GM/100ML-% IV SOLN
2.0000 g | Freq: Four times a day (QID) | INTRAVENOUS | Status: AC
  Administered 2015-09-25 (×2): 2 g via INTRAVENOUS
  Filled 2015-09-25 (×2): qty 100

## 2015-09-25 MED ORDER — EPHEDRINE SULFATE 50 MG/ML IJ SOLN
INTRAMUSCULAR | Status: AC
  Filled 2015-09-25: qty 1

## 2015-09-25 MED ORDER — TRANEXAMIC ACID 1000 MG/10ML IV SOLN
1000.0000 mg | INTRAVENOUS | Status: AC
  Administered 2015-09-25: 1000 mg via INTRAVENOUS
  Filled 2015-09-25: qty 10

## 2015-09-25 MED ORDER — CEFAZOLIN SODIUM-DEXTROSE 2-4 GM/100ML-% IV SOLN
2.0000 g | INTRAVENOUS | Status: AC
  Administered 2015-09-25: 2 g via INTRAVENOUS
  Filled 2015-09-25: qty 100

## 2015-09-25 MED ORDER — LORAZEPAM 0.5 MG PO TABS: 0.5000 mg | ORAL_TABLET | Freq: Every evening | ORAL | Status: DC | PRN

## 2015-09-25 MED ORDER — DOCUSATE SODIUM 100 MG PO CAPS
100.0000 mg | ORAL_CAPSULE | Freq: Two times a day (BID) | ORAL | Status: DC
  Administered 2015-09-25 – 2015-09-26 (×2): 100 mg via ORAL
  Filled 2015-09-25 (×2): qty 1

## 2015-09-25 MED ORDER — PROPOFOL 500 MG/50ML IV EMUL
INTRAVENOUS | Status: DC | PRN
Start: 2015-09-25 — End: 2015-09-25
  Administered 2015-09-25: 100 ug/kg/min via INTRAVENOUS

## 2015-09-25 MED ORDER — TRANEXAMIC ACID 1000 MG/10ML IV SOLN
1000.0000 mg | Freq: Once | INTRAVENOUS | Status: AC
  Administered 2015-09-25: 1000 mg via INTRAVENOUS
  Filled 2015-09-25: qty 10

## 2015-09-25 MED ORDER — METHOCARBAMOL 500 MG PO TABS
500.0000 mg | ORAL_TABLET | Freq: Four times a day (QID) | ORAL | Status: DC | PRN
  Administered 2015-09-26 (×2): 500 mg via ORAL
  Filled 2015-09-25 (×2): qty 1

## 2015-09-25 MED ORDER — ALUM & MAG HYDROXIDE-SIMETH 200-200-20 MG/5ML PO SUSP: 30.0000 mL | ORAL | Status: DC | PRN

## 2015-09-25 MED ORDER — DEXAMETHASONE SODIUM PHOSPHATE 10 MG/ML IJ SOLN
10.0000 mg | Freq: Once | INTRAMUSCULAR | Status: AC
  Administered 2015-09-26: 10 mg via INTRAVENOUS
  Filled 2015-09-25: qty 1

## 2015-09-25 MED ORDER — FENTANYL CITRATE (PF) 100 MCG/2ML IJ SOLN
INTRAMUSCULAR | Status: DC | PRN
  Administered 2015-09-25: 100 ug via INTRAVENOUS

## 2015-09-25 MED ORDER — MENTHOL 3 MG MT LOZG: 1.0000 | LOZENGE | OROMUCOSAL | Status: DC | PRN

## 2015-09-25 MED ORDER — SODIUM CHLORIDE 0.9 % IR SOLN
Status: DC | PRN
  Administered 2015-09-25: 1000 mL

## 2015-09-25 MED ORDER — POLYETHYLENE GLYCOL 3350 17 G PO PACK
17.0000 g | PACK | Freq: Two times a day (BID) | ORAL | Status: DC
  Administered 2015-09-25 – 2015-09-26 (×2): 17 g via ORAL
  Filled 2015-09-25 (×2): qty 1

## 2015-09-25 MED ORDER — EPHEDRINE SULFATE 50 MG/ML IJ SOLN
INTRAMUSCULAR | Status: DC | PRN
  Administered 2015-09-25: 10 mg via INTRAVENOUS
  Administered 2015-09-25 (×2): 5 mg via INTRAVENOUS
  Administered 2015-09-25: 10 mg via INTRAVENOUS

## 2015-09-25 MED ORDER — CHLORHEXIDINE GLUCONATE 4 % EX LIQD: 60.0000 mL | Freq: Once | CUTANEOUS | Status: DC

## 2015-09-25 SURGICAL SUPPLY — 44 items
BAG DECANTER FOR FLEXI CONT (MISCELLANEOUS) IMPLANT
BAG ZIPLOCK 12X15 (MISCELLANEOUS) IMPLANT
BANDAGE ACE 6X5 VEL STRL LF (GAUZE/BANDAGES/DRESSINGS) ×3 IMPLANT
BLADE SAW SGTL 13.0X1.19X90.0M (BLADE) ×3 IMPLANT
BOWL SMART MIX CTS (DISPOSABLE) ×3 IMPLANT
CAPT KNEE TOTAL 3 ATTUNE ×3 IMPLANT
CEMENT HV SMART SET (Cement) ×6 IMPLANT
CLOTH BEACON ORANGE TIMEOUT ST (SAFETY) ×3 IMPLANT
CUFF TOURN SGL QUICK 34 (TOURNIQUET CUFF) ×3
CUFF TRNQT CYL 34X4X40X1 (TOURNIQUET CUFF) ×1 IMPLANT
DECANTER SPIKE VIAL GLASS SM (MISCELLANEOUS) ×3 IMPLANT
DRAPE U-SHAPE 47X51 STRL (DRAPES) ×3 IMPLANT
DRESSING AQUACEL AG SP 3.5X10 (GAUZE/BANDAGES/DRESSINGS) ×1 IMPLANT
DRSG AQUACEL AG SP 3.5X10 (GAUZE/BANDAGES/DRESSINGS) ×3
DURAPREP 26ML APPLICATOR (WOUND CARE) ×6 IMPLANT
ELECT REM PT RETURN 9FT ADLT (ELECTROSURGICAL) ×3
ELECTRODE REM PT RTRN 9FT ADLT (ELECTROSURGICAL) ×1 IMPLANT
GLOVE BIOGEL M 7.0 STRL (GLOVE) IMPLANT
GLOVE BIOGEL PI IND STRL 7.5 (GLOVE) ×1 IMPLANT
GLOVE BIOGEL PI IND STRL 8.5 (GLOVE) ×1 IMPLANT
GLOVE BIOGEL PI INDICATOR 7.5 (GLOVE) ×2
GLOVE BIOGEL PI INDICATOR 8.5 (GLOVE) ×2
GLOVE ECLIPSE 8.0 STRL XLNG CF (GLOVE) ×3 IMPLANT
GLOVE ORTHO TXT STRL SZ7.5 (GLOVE) ×6 IMPLANT
GOWN STRL REUS W/TWL LRG LVL3 (GOWN DISPOSABLE) ×3 IMPLANT
GOWN STRL REUS W/TWL XL LVL3 (GOWN DISPOSABLE) ×3 IMPLANT
HANDPIECE INTERPULSE COAX TIP (DISPOSABLE) ×2
LIQUID BAND (GAUZE/BANDAGES/DRESSINGS) ×3 IMPLANT
MANIFOLD NEPTUNE II (INSTRUMENTS) ×3 IMPLANT
PACK TOTAL KNEE CUSTOM (KITS) ×3 IMPLANT
POSITIONER SURGICAL ARM (MISCELLANEOUS) ×3 IMPLANT
SET HNDPC FAN SPRY TIP SCT (DISPOSABLE) ×1 IMPLANT
SET PAD KNEE POSITIONER (MISCELLANEOUS) ×3 IMPLANT
SUT MNCRL AB 4-0 PS2 18 (SUTURE) ×3 IMPLANT
SUT VIC AB 1 CT1 36 (SUTURE) ×3 IMPLANT
SUT VIC AB 2-0 CT1 27 (SUTURE) ×6
SUT VIC AB 2-0 CT1 TAPERPNT 27 (SUTURE) ×3 IMPLANT
SUT VLOC 180 0 24IN GS25 (SUTURE) ×3 IMPLANT
SYR 50ML LL SCALE MARK (SYRINGE) ×3 IMPLANT
TRAY FOLEY W/METER SILVER 14FR (SET/KITS/TRAYS/PACK) ×3 IMPLANT
TRAY FOLEY W/METER SILVER 16FR (SET/KITS/TRAYS/PACK) ×3 IMPLANT
WATER STERILE IRR 1500ML POUR (IV SOLUTION) ×3 IMPLANT
WRAP KNEE MAXI GEL POST OP (GAUZE/BANDAGES/DRESSINGS) ×3 IMPLANT
YANKAUER SUCT BULB TIP 10FT TU (MISCELLANEOUS) ×3 IMPLANT

## 2015-09-25 NOTE — Interval H&P Note (Signed)
History and Physical Interval Note:  09/25/2015 9:03 AM  Kaitlyn Melton  has presented today for surgery, with the diagnosis of LEFT KNEE OA   The various methods of treatment have been discussed with the patient and family. After consideration of risks, benefits and other options for treatment, the patient has consented to  Procedure(s): LEFT TOTAL KNEE ARTHROPLASTY (Left) as a surgical intervention .  The patient's history has been reviewed, patient examined, no change in status, stable for surgery.  I have reviewed the patient's chart and labs.  Questions were answered to the patient's satisfaction.     Shelda PalLIN,Amil Bouwman D

## 2015-09-25 NOTE — Anesthesia Postprocedure Evaluation (Signed)
Anesthesia Post Note  Patient: Leonette NuttingFrances E Garza  Procedure(s) Performed: Procedure(s) (LRB): LEFT TOTAL KNEE ARTHROPLASTY (Left)  Patient location during evaluation: PACU Anesthesia Type: Spinal Level of consciousness: oriented and awake and alert Pain management: pain level controlled Vital Signs Assessment: post-procedure vital signs reviewed and stable Respiratory status: spontaneous breathing, respiratory function stable and patient connected to nasal cannula oxygen Cardiovascular status: blood pressure returned to baseline and stable Postop Assessment: no headache, no backache, patient able to bend at knees, spinal receding and no signs of nausea or vomiting Anesthetic complications: no    Last Vitals:  Filed Vitals:   09/25/15 1300 09/25/15 1313  BP: 119/63 119/48  Pulse: 58 57  Temp: 36.9 C 36.5 C  Resp: 18 18    Last Pain:  Filed Vitals:   09/25/15 1314  PainSc: 0-No pain                 Cecile HearingStephen Edward Ronan Dion

## 2015-09-25 NOTE — Op Note (Signed)
NAME:  Kaitlyn Melton                      MEDICAL RECORD NO.:  [id number]                             FACILITY[facility]  [facility]      PHYSICIAN:  Madlyn FrankelMatthew D. Charlann Boxerlin, M.D.  DATE OF BIRTH:  May 20, 1955      DATE OF PROCEDURE:  09/25/2015                                     OPERATIVE REPORT         PREOPERATIVE DIAGNOSIS:  Left knee osteoarthritis.      POSTOPERATIVE DIAGNOSIS:  Left knee osteoarthritis.      FINDINGS:  The patient was noted to have complete loss of cartilage and   bone-on-bone arthritis with associated osteophytes in the medial and patellofemoral compartments of   the knee with a significant synovitis and associated effusion.      PROCEDURE:  Left total knee replacement.      COMPONENTS USED:  DePuy Attune rotating platform posterior stabilized knee   system, a size 6N femur, size 4 tibia, size 6 mm PS AOX insert, and 32 anatomic patellar   button.      SURGEON:  Madlyn FrankelMatthew D. Charlann Boxerlin, M.D.      ASSISTANT:  Lanney GinsMatthew Babish, PA-C.      ANESTHESIA:  Spinal.      SPECIMENS:  None.      COMPLICATION:  None.      DRAINS:  None.  EBL: <50cc      TOURNIQUET TIME:   Total Tourniquet Time Documented: Thigh (Left) - 29 minutes Total: Thigh (Left) - 29 minutes  .      The patient was stable to the recovery room.      INDICATION FOR PROCEDURE:  Kaitlyn Melton is a 60 y.o. female patient of   mine.  The patient had been seen, evaluated, and treated conservatively in the   office with medication, activity modification, and injections.  The patient had   radiographic changes of bone-on-bone arthritis with endplate sclerosis and osteophytes noted.      The patient failed conservative measures including medication, injections, and activity modification, and at this point was ready for more definitive measures.   Based on the radiographic changes and failed conservative measures, the patient   decided to proceed with total knee replacement.  Risks of infection,   DVT,  component failure, need for revision surgery, postop course, and   expectations were all   discussed and reviewed.  Consent was obtained for benefit of pain   relief.      PROCEDURE IN DETAIL:  The patient was brought to the operative theater.   Once adequate anesthesia, preoperative antibiotics, 2 gm of Ancef, 1 gm of Tranexamic Acid, and 10 mg of Decadron administered, the patient was positioned supine with the left thigh tourniquet placed.  The  left lower extremity was prepped and draped in sterile fashion.  A time-   out was performed identifying the patient, planned procedure, and   extremity.      The left lower extremity [facility].  The leg was   exsanguinated, tourniquet elevated to 250 mmHg.  A midline incision was  made followed by median parapatellar arthrotomy.  Following initial   exposure, attention was first directed to the patella.  Precut   measurement was noted to be 20 mm.  I resected down to 14 mm and used a   32 anatiomic patellar button to restore patellar height as well as cover the cut   surface.      The lug holes were drilled and a metal shim was placed to protect the   patella from retractors and saw blades.      At this point, attention was now directed to the femur.  The femoral   canal was opened with a drill, irrigated to try to prevent fat emboli.  An   intramedullary rod was passed at 3 degrees valgus, 9 mm of bone was   resected off the distal femur.  Following this resection, the tibia was   subluxated anteriorly.  Using the extramedullary guide, 2 mm of bone was resected off   the proximal medial tibia.  We confirmed the gap would be   stable medially and laterally with a size 5 mm insert as well as confirmed   the cut was perpendicular in the coronal plane, checking with an alignment rod.      Once this was done, I sized the femur to be a size 6 in the anterior-   posterior dimension, chose a narrow component based on  medial and   lateral dimension.  The size 6 rotation block was then pinned in   position anterior referenced using the C-clamp to set rotation.  The   anterior, posterior, and  chamfer cuts were made without difficulty nor   notching making certain that I was along the anterior cortex to help   with flexion gap stability.      The final box cut was made off the lateral aspect of distal femur.      At this point, the tibia was sized to be a size 4, the size 4 tray was   then pinned in position through the medial third of the tubercle,   drilled, and keel punched.  Trial reduction was now carried with a 6 femur,  4 tibia, a size 6 mm insert, and the 32 anatomic patella botton.  The knee was brought to   extension, full extension with good flexion stability with the patella   tracking through the trochlea without application of pressure.  Given   all these findings, the trial components removed.  Final components were   opened and cement was mixed.  The knee was irrigated with normal saline   solution and pulse lavage.  The synovial lining was   then injected with 30 cc of 0.25% Marcaine with epinephrine and 1 cc of Toradol plus 30 cc of NS for a   total of 61 cc.      The knee was irrigated.  Final implants were then cemented onto clean and   dried cut surfaces of bone with the knee brought to extension with a size 6 mm trial insert.      Once the cement had fully cured, the excess cement was removed   throughout the knee.  I confirmed I was satisfied with the range of   motion and stability, and the final size 6 mm PS AOX insert was chosen.  It was   placed into the knee.      The tourniquet had been let down at 29 minutes.  No significant   hemostasis  required.  The   extensor mechanism was then reapproximated using #1 Vicryl and #0 V-lock sutures with the knee   in flexion.  The   remaining wound was closed with 2-0 Vicryl and running 4-0 Monocryl.   The knee was cleaned, dried,  dressed sterilely using Dermabond and   Aquacel dressing.  The patient was then   brought to recovery room in stable condition, tolerating the procedure   well.   Please note that Physician Assistant, Lanney Gins, PA-C, was present for the entirety of the case, and was utilized for pre-operative positioning, peri-operative retractor management, general facilitation of the procedure.  He was also utilized for primary wound closure at the end of the case.              Madlyn Frankel Charlann Boxer, M.D.    09/25/2015 11:26 AM

## 2015-09-25 NOTE — Anesthesia Preprocedure Evaluation (Addendum)
Anesthesia Evaluation  Patient identified by MRN, date of birth, ID band Patient awake    Reviewed: Allergy & Precautions, NPO status , Patient's Chart, lab work & pertinent test results, reviewed documented beta blocker date and time   Airway Mallampati: II  TM Distance: >3 FB Neck ROM: Full    Dental  (+) Teeth Intact, Dental Advisory Given, Caps,    Pulmonary neg pulmonary ROS,    Pulmonary exam normal breath sounds clear to auscultation       Cardiovascular hypertension, Pt. on home beta blockers and Pt. on medications Normal cardiovascular exam Rhythm:Regular Rate:Normal  S/p MVR 2014   Neuro/Psych negative neurological ROS  negative psych ROS   GI/Hepatic negative GI ROS, Neg liver ROS,   Endo/Other  Hypothyroidism Obesity   Renal/GU negative Renal ROS     Musculoskeletal  (+) Arthritis , Osteoarthritis,    Abdominal   Peds  Hematology negative hematology ROS (+) Plt 183k   Anesthesia Other Findings Day of surgery medications reviewed with the patient.  Reproductive/Obstetrics                          Anesthesia Physical Anesthesia Plan  ASA: III  Anesthesia Plan: Spinal   Post-op Pain Management:    Induction:   Airway Management Planned:   Additional Equipment:   Intra-op Plan:   Post-operative Plan:   Informed Consent: I have reviewed the patients History and Physical, chart, labs and discussed the procedure including the risks, benefits and alternatives for the proposed anesthesia with the patient or authorized representative who has indicated his/her understanding and acceptance.   Dental advisory given  Plan Discussed with: CRNA, Anesthesiologist and Surgeon  Anesthesia Plan Comments: (Discussed risks and benefits of and differences between spinal and general. Discussed risks of spinal including headache, backache, failure, bleeding, infection, and nerve damage.  Patient consents to spinal. Questions answered. Coagulation studies and platelet count acceptable.)        Anesthesia Quick Evaluation

## 2015-09-25 NOTE — Transfer of Care (Signed)
Immediate Anesthesia Transfer of Care Note  Patient: Leonette NuttingFrances E Burrous  Procedure(s) Performed: Procedure(s): LEFT TOTAL KNEE ARTHROPLASTY (Left)  Patient Location: PACU  Anesthesia Type:Spinal  Level of Consciousness: awake, alert , oriented and patient cooperative  Airway & Oxygen Therapy: Patient Spontanous Breathing and Patient connected to face mask oxygen  Post-op Assessment: Report given to RN and Post -op Vital signs reviewed and stable  Post vital signs: Reviewed and stable  Last Vitals:  Filed Vitals:   09/25/15 0712  BP: 109/40  Pulse: 66  Temp: 36.9 C  Resp: 16    Last Pain:  Filed Vitals:   09/25/15 0901  PainSc: 4       Patients Stated Pain Goal: 4 (09/25/15 0900)  Complications: No apparent anesthesia complications

## 2015-09-25 NOTE — Evaluation (Signed)
Physical Therapy Evaluation Patient Details Name: Kaitlyn NuttingFrances E Legere MRN: [id number] DOB: [birth year] Today's Date: 09/25/2015   History of Present Illness  60 y.o. female with h/o HTN, back surgery, mitral valve replacement admitted with L TKA. Pt plans to have R TKA in 6 weeks.   Clinical Impression  Pt is s/p TKA resulting in the deficits listed below (see PT Problem List). Pt ambulated 2375' with RW and min/guard assist. Initiated TKA exercises. Good progress expected.  Pt will benefit from skilled PT to increase their independence and safety with mobility to allow discharge to the venue listed below.     Follow Up Recommendations Home health PT    Equipment Recommendations  Rolling walker with 5" wheels    Recommendations for Other Services OT consult     Precautions / Restrictions Precautions Precautions: Fall;Knee Restrictions Weight Bearing Restrictions: No Other Position/Activity Restrictions: WBAT      Mobility  Bed Mobility Overal bed mobility: Modified Independent             General bed mobility comments: instructed to self assist LLE with RLE, HOB up 35*  Transfers Overall transfer level: Needs assistance Equipment used: Rolling walker (2 wheeled) Transfers: Sit to/from Stand Sit to Stand: Min assist         General transfer comment: VCs hand placement, min A to rise/steady  Ambulation/Gait Ambulation/Gait assistance: Min guard Ambulation Distance (Feet): 75 Feet Assistive device: Rolling walker (2 wheeled) Gait Pattern/deviations: Step-to pattern;Antalgic;Trunk flexed   Gait velocity interpretation: Below normal speed for age/gender General Gait Details: steady with RW, VCs sequencing and to lift head, min/guard for safety, no LOB  Stairs            Wheelchair Mobility    Modified Rankin (Stroke Patients Only)       Balance Overall balance assessment: Modified Independent                                            Pertinent Vitals/Pain Pain Assessment: 0-10 Pain Score: 6  Pain Descriptors / Indicators: Sore Pain Intervention(s): Monitored during session;Ice applied;Premedicated before session;Limited activity within patient's tolerance    Home Living Family/patient expects to be discharged to:: Private residence Living Arrangements: Spouse/significant other Available Help at Discharge: Family;Available 24 hours/day   Home Access: Stairs to enter Entrance Stairs-Rails: Right;Left;Can reach both Entrance Stairs-Number of Steps: 2 Home Layout: One level Home Equipment: Cane - single point;Shower seat - built in;Hand held shower head      Prior Function Level of Independence: Independent               Higher education careers adviserHand Dominance        Extremity/Trunk Assessment   Upper Extremity Assessment: Overall WFL for tasks assessed           Lower Extremity Assessment: LLE deficits/detail   LLE Deficits / Details: 5-30* AAROM L knee, ankle WNL, SLR -3/5, sensation intact to light touch  Cervical / Trunk Assessment: Normal  Communication   Communication: No difficulties  Cognition Arousal/Alertness: Awake/alert Behavior During Therapy: WFL for tasks assessed/performed Overall Cognitive Status: Within Functional Limits for tasks assessed                      General Comments      Exercises Total Joint Exercises Ankle Circles/Pumps: AROM;Both;10 reps;Supine Quad Sets: AROM;Both;5 reps;Supine Heel Slides: AAROM;Right;10 reps;Supine  Straight Leg Raises: AAROM;Right;5 reps;Supine      Assessment/Plan    PT Assessment Patient needs continued PT services  PT Diagnosis Difficulty walking;Acute pain   PT Problem List Decreased strength;Decreased range of motion;Decreased activity tolerance;Pain;Decreased knowledge of use of DME;Decreased mobility  PT Treatment Interventions DME instruction;Gait training;Stair training;Functional mobility training;Therapeutic  activities;Patient/family education;Therapeutic exercise   PT Goals (Current goals can be found in the Care Plan section) Acute Rehab PT Goals Patient Stated Goal: to go shopping with her daughter PT Goal Formulation: With patient/family Time For Goal Achievement: 10/02/15 Potential to Achieve Goals: Good    Frequency 7X/week   Barriers to discharge        Co-evaluation               End of Session Equipment Utilized During Treatment: Gait belt Activity Tolerance: Patient tolerated treatment well Patient left: in chair;with call bell/phone within reach;with chair alarm set;with family/visitor present Nurse Communication: Mobility status         Time: 7829-5621 PT Time Calculation (min) (ACUTE ONLY): 23 min   Charges:   PT Evaluation $PT Eval Low Complexity: 1 Procedure PT Treatments $Gait Training: 8-22 mins   PT G Codes:        Tamala Ser 09/25/2015, 3:38 PM [phone]

## 2015-09-25 NOTE — Discharge Instructions (Signed)

## 2015-09-25 NOTE — Anesthesia Procedure Notes (Signed)
Spinal Patient location during procedure: OR End time: 09/25/2015 10:05 AM Staffing Resident/CRNA: Noralyn Pick D Performed by: anesthesiologist and resident/CRNA  Preanesthetic Checklist Completed: patient identified, site marked, surgical consent, pre-op evaluation, timeout performed, IV checked, risks and benefits discussed and monitors and equipment checked Spinal Block Patient position: sitting Prep: Betadine Patient monitoring: heart rate, continuous pulse ox and blood pressure Location: L3-4 Injection technique: single-shot Needle Needle type: Sprotte and Spinocan  Needle gauge: 22 G Needle length: 9 cm Assessment Sensory level: T6 Additional Notes Expiration date of kit checked and confirmed. Patient tolerated procedure well, without complications.

## 2015-09-26 LAB — CBC
HCT: 34.7 % — ABNORMAL LOW (ref 36.0–46.0)
HEMOGLOBIN: 11.1 g/dL — AB (ref 12.0–15.0)
MCH: 29.7 pg (ref 26.0–34.0)
MCHC: 32 g/dL (ref 30.0–36.0)
MCV: 92.8 fL (ref 78.0–100.0)
PLATELETS: 152 10*3/uL (ref 150–400)
RBC: 3.74 MIL/uL — ABNORMAL LOW (ref 3.87–5.11)
RDW: 13.5 % (ref 11.5–15.5)
WBC: 9.6 10*3/uL (ref 4.0–10.5)

## 2015-09-26 LAB — BASIC METABOLIC PANEL
Anion gap: 4 — ABNORMAL LOW (ref 5–15)
BUN: 17 mg/dL (ref 6–20)
CALCIUM: 8.4 mg/dL — AB (ref 8.9–10.3)
CO2: 26 mmol/L (ref 22–32)
CREATININE: 0.65 mg/dL (ref 0.44–1.00)
Chloride: 107 mmol/L (ref 101–111)
GFR calc non Af Amer: >60 mL/min (ref >60)
Glucose, Bld: 127 mg/dL — ABNORMAL HIGH (ref 65–99)
Potassium: 3.9 mmol/L (ref 3.5–5.1)
SODIUM: 137 mmol/L (ref 135–145)

## 2015-09-26 MED ORDER — FERROUS SULFATE 325 (65 FE) MG PO TABS: 325.0000 mg | ORAL_TABLET | Freq: Three times a day (TID) | ORAL | Status: AC

## 2015-09-26 MED ORDER — ACETAMINOPHEN 500 MG PO TABS: 1000.0000 mg | ORAL_TABLET | Freq: Three times a day (TID) | ORAL | Status: AC

## 2015-09-26 MED ORDER — OXYCODONE HCL 5 MG PO TABS
5.0000 mg | ORAL_TABLET | ORAL | Status: DC
  Administered 2015-09-26: 10 mg via ORAL
  Filled 2015-09-26: qty 2

## 2015-09-26 MED ORDER — METHOCARBAMOL 500 MG PO TABS: 500.0000 mg | ORAL_TABLET | Freq: Four times a day (QID) | ORAL | Status: AC | PRN

## 2015-09-26 MED ORDER — ACETAMINOPHEN 500 MG PO TABS
1000.0000 mg | ORAL_TABLET | Freq: Three times a day (TID) | ORAL | Status: DC
  Administered 2015-09-26: 1000 mg via ORAL
  Filled 2015-09-26: qty 2

## 2015-09-26 MED ORDER — CELECOXIB 200 MG PO CAPS: 200.0000 mg | ORAL_CAPSULE | Freq: Two times a day (BID) | ORAL | Status: AC

## 2015-09-26 MED ORDER — DOCUSATE SODIUM 100 MG PO CAPS: 100.0000 mg | ORAL_CAPSULE | Freq: Two times a day (BID) | ORAL | Status: AC

## 2015-09-26 MED ORDER — ASPIRIN 325 MG PO TBEC: 325.0000 mg | DELAYED_RELEASE_TABLET | Freq: Two times a day (BID) | ORAL | Status: AC

## 2015-09-26 MED ORDER — POLYETHYLENE GLYCOL 3350 17 G PO PACK: 17.0000 g | PACK | Freq: Two times a day (BID) | ORAL | Status: AC

## 2015-09-26 MED ORDER — OXYCODONE HCL 5 MG PO TABS: 5.0000 mg | ORAL_TABLET | ORAL | Status: AC | PRN

## 2015-09-26 NOTE — Progress Notes (Signed)
Occupational Therapy Treatment Patient Details Name: Kaitlyn Melton MRN: [id number] DOB: 1955-04-18 Today's Date: 09/26/2015    History of present illness 60 y.o. female with h/o HTN, back surgery, mitral valve replacement admitted with L TKA. Pt plans to have R TKA in 6 weeks.    OT comments  All goals met this session; no further OT needs  Follow Up Recommendations  Supervision/Assistance - 24 hour    Equipment Recommendations  3 in 1 bedside comode    Recommendations for Other Services      Precautions / Restrictions Precautions Precautions: Fall;Knee Restrictions Weight Bearing Restrictions: No Other Position/Activity Restrictions: WBAT       Mobility Bed Mobility               General bed mobility comments: up in chair  Transfers Overall transfer level: Needs assistance Equipment used: Rolling walker (2 wheeled) Transfers: Sit to/from Stand Sit to Stand: Modified independent (Device/Increase time)         General transfer comment: VCs hand placement    Balance                                   ADL       Grooming: Wash/dry hands;Supervision/safety;Standing                   Toilet Transfer: Modified Independent;BSC;RW   Toileting- Clothing Manipulation and Hygiene: Supervision/safety;Sit to/from stand   Tub/ Shower Transfer: Walk-in shower;Min guard;Ambulation;3 in 1            Vision                     Perception     Praxis      Cognition   Behavior During Therapy: WFL for tasks assessed/performed Overall Cognitive Status: Within Functional Limits for tasks assessed                       Extremity/Trunk Assessment               Exercises    Shoulder Instructions       General Comments      Pertinent Vitals/ Pain       Pain Score: 4  Pain Location: L knee Pain Descriptors / Indicators: Sore Pain Intervention(s): Limited activity within patient's tolerance;Monitored  during session;Premedicated before session;Repositioned;Ice applied  Home Living                                          Prior Functioning/Environment              Frequency       Progress Toward Goals  OT Goals(current goals can now be found in the care plan section)  Progress towards OT goals: Goals met/education completed, patient discharged from OT  Acute Rehab OT Goals Patient Stated Goal: to go shopping with her daughter  Plan      Co-evaluation                 End of Session     Activity Tolerance Patient tolerated treatment well   Patient Left in bed;with call bell/phone within reach;with family/visitor present   Nurse Communication          Time: 1215-1224 OT Time Calculation (min): 9 min  Charges: OT General Charges $OT Visit: 1 Procedure OT Treatments $Self Care/Home Management : 8-22 mins  Blease Capaldi 09/26/2015, 12:44 PM  Lesle Chris, OTR/L [phone] 09/26/2015

## 2015-09-26 NOTE — Evaluation (Signed)
Occupational Therapy Evaluation Patient Details Name: Leonette NuttingFrances E Hartland MRN: [id number] DOB: 1956-02-07 Today's Date: 09/26/2015    History of Present Illness 60 y.o. female with h/o HTN, back surgery, mitral valve replacement admitted with L TKA. Pt plans to have R TKA in 6 weeks.    Clinical Impression   Pt was admitted for the above surgery. Will see her for one more session prior to discharge to educate on bathroom transfers    Follow Up Recommendations  Supervision/Assistance - 24 hour    Equipment Recommendations  3 in 1 bedside comode    Recommendations for Other Services       Precautions / Restrictions Precautions Precautions: Fall;Knee Restrictions Weight Bearing Restrictions: No      Mobility Bed Mobility Overal bed mobility: Modified Independent                Transfers   Equipment used: Rolling walker (2 wheeled) Transfers: Sit to/from Stand Sit to Stand: Min guard         General transfer comment: for safety    Balance                                            ADL Overall ADL's : Needs assistance/impaired     Grooming: Set up;Sitting   Upper Body Bathing: Set up;Sitting   Lower Body Bathing: Minimal assistance;Sit to/from stand   Upper Body Dressing : Set up;Sitting   Lower Body Dressing: Minimal assistance;Sit to/from stand   Toilet Transfer: Min guard;Stand-pivot;RW (chair)   Toileting- ArchitectClothing Manipulation and Hygiene: Min guard;Sit to/from stand         General ADL Comments: Pt just had catheter out; premedicated but pain still 6/10. Will return later for bathroom transfers     Vision     Perception     Praxis      Pertinent Vitals/Pain Pain Assessment: 0-10 Pain Score: 6  Pain Location: L knee Pain Descriptors / Indicators: Aching Pain Intervention(s): Limited activity within patient's tolerance;Monitored during session;Premedicated before session;Repositioned;Ice applied     Hand  Dominance     Extremity/Trunk Assessment Upper Extremity Assessment Upper Extremity Assessment: Overall WFL for tasks assessed           Communication Communication Communication: No difficulties   Cognition Arousal/Alertness: Awake/alert Behavior During Therapy: WFL for tasks assessed/performed Overall Cognitive Status: Within Functional Limits for tasks assessed                     General Comments       Exercises       Shoulder Instructions      Home Living Family/patient expects to be discharged to:: Private residence Living Arrangements: Spouse/significant other Available Help at Discharge: Family;Available 24 hours/day (husband and daughter)               Bathroom Shower/Tub: Walk-in Human resources officershower   Bathroom Toilet: Standard     Home Equipment: Cane - single point;Shower seat - built in;Hand held shower head          Prior Functioning/Environment Level of Independence: Independent             OT Diagnosis: Acute pain   OT Problem List: Decreased activity tolerance;Pain;Decreased knowledge of use of DME or AE   OT Treatment/Interventions: Self-care/ADL training;DME and/or AE instruction;Patient/family education    OT Goals(Current goals can be found  in the care plan section) Acute Rehab OT Goals Patient Stated Goal: to go shopping with her daughter OT Goal Formulation: With patient Time For Goal Achievement: 10/03/15 Potential to Achieve Goals: Good ADL Goals Pt Will Perform Grooming: with supervision;standing Pt Will Transfer to Toilet: with supervision;ambulating;bedside commode Pt Will Perform Toileting - Clothing Manipulation and hygiene: with supervision;sit to/from stand Pt Will Perform Tub/Shower Transfer: Shower transfer;with min guard assist;ambulating;rolling walker;shower seat  OT Frequency: Min 2X/week   Barriers to D/C:            Co-evaluation              End of Session    Activity Tolerance: Patient tolerated  treatment well Patient left: in chair;with call bell/phone within reach;with family/visitor present   Time: 1191-4782 OT Time Calculation (min): 19 min Charges:  OT General Charges $OT Visit: 1 Procedure OT Evaluation $OT Eval Low Complexity: 1 Procedure G-Codes:    Ahnesti Townsend October 18, 2015, 7:55 AM  Marica Otter, OTR/L [phone] Oct 18, 2015

## 2015-09-26 NOTE — Progress Notes (Signed)
Physical Therapy Treatment Patient Details Name: Kaitlyn NuttingFrances E Mundie MRN: [id number] DOB: [birth year] Today's Date: 09/26/2015    History of Present Illness 60 y.o. female with h/o HTN, back surgery, mitral valve replacement admitted with L TKA. Pt plans to have R TKA in 6 weeks.     PT Comments    Stair training completed, pt ready to DC from PT standpoint.   Follow Up Recommendations  Home health PT     Equipment Recommendations  Rolling walker with 5" wheels    Recommendations for Other Services OT consult     Precautions / Restrictions Precautions Precautions: Fall;Knee Restrictions Weight Bearing Restrictions: No Other Position/Activity Restrictions: WBAT    Mobility  Bed Mobility               General bed mobility comments: up in chair  Transfers Overall transfer level: Needs assistance Equipment used: Rolling walker (2 wheeled) Transfers: Sit to/from Stand Sit to Stand: Modified independent (Device/Increase time)         General transfer comment: VCs hand placement  Ambulation/Gait Ambulation/Gait assistance: Modified independent (Device/Increase time) Ambulation Distance (Feet): 200 Feet Assistive device: Rolling walker (2 wheeled) Gait Pattern/deviations: Step-to pattern   Gait velocity interpretation: Below normal speed for age/gender General Gait Details: steady with RW, , no LOB   Stairs Stairs: Yes Stairs assistance: Supervision Stair Management: Two rails;Forwards;Step to pattern Number of Stairs: 2 General stair comments: good sequencing, no LOB, no physical assist  Wheelchair Mobility    Modified Rankin (Stroke Patients Only)       Balance                                    Cognition Arousal/Alertness: Awake/alert Behavior During Therapy: WFL for tasks assessed/performed Overall Cognitive Status: Within Functional Limits for tasks assessed                      Exercises     General Comments         Pertinent Vitals/Pain Pain Score: 4  Pain Location: L knee with walking Pain Descriptors / Indicators: Sore Pain Intervention(s): Monitored during session;Premedicated before session;Limited activity within patient's tolerance;Ice applied    Home Living                      Prior Function            PT Goals (current goals can now be found in the care plan section) Acute Rehab PT Goals Patient Stated Goal: to go shopping with her daughter PT Goal Formulation: With patient/family Time For Goal Achievement: 10/02/15 Potential to Achieve Goals: Good Progress towards PT goals: Progressing toward goals    Frequency  7X/week    PT Plan Current plan remains appropriate    Co-evaluation             End of Session Equipment Utilized During Treatment: Gait belt Activity Tolerance: Patient tolerated treatment well Patient left: in chair;with call bell/phone within reach;with family/visitor present     Time: 1203-1214 PT Time Calculation (min) (ACUTE ONLY): 11 min  Charges:  $Gait Training: 8-22 mins $Therapeutic Exercise: 8-22 mins                    G Codes:      Tamala SerUhlenberg, Lusero Nordlund Kistler 09/26/2015, 12:23 PM [phone]

## 2015-09-26 NOTE — Progress Notes (Signed)
Physical Therapy Treatment Patient Details Name: Kaitlyn Melton DOB: Jan 25, 1956 Today's Date: 09/26/2015    History of Present Illness 60 y.o. female with h/o HTN, back surgery, mitral valve replacement admitted with L TKA. Pt plans to have R TKA in 6 weeks.     PT Comments    Pt progressing well with mobility, she ambulated 160' with RW with supervision, performed TKA exercises with min A. Will do stair training this afternoon, then expect she'll be ready to DC home from PT standpoint.   Follow Up Recommendations  Home health PT     Equipment Recommendations  Rolling walker with 5" wheels    Recommendations for Other Services OT consult     Precautions / Restrictions Precautions Precautions: Fall;Knee Restrictions Weight Bearing Restrictions: No Other Position/Activity Restrictions: WBAT    Mobility  Bed Mobility Overal bed mobility: Modified Independent             General bed mobility comments: up in chair  Transfers Overall transfer level: Needs assistance Equipment used: Rolling walker (2 wheeled) Transfers: Sit to/from Stand Sit to Stand: Supervision         General transfer comment: VCs hand placement  Ambulation/Gait Ambulation/Gait assistance: Supervision Ambulation Distance (Feet): 160 Feet Assistive device: Rolling walker (2 wheeled) Gait Pattern/deviations: Step-to pattern   Gait velocity interpretation: Below normal speed for age/gender General Gait Details: steady with RW, VCs sequencing and to lift head, no LOB   Stairs            Wheelchair Mobility    Modified Rankin (Stroke Patients Only)       Balance                                    Cognition Arousal/Alertness: Awake/alert Behavior During Therapy: WFL for tasks assessed/performed Overall Cognitive Status: Within Functional Limits for tasks assessed                      Exercises Total Joint Exercises Ankle  Circles/Pumps: AROM;Both;10 reps;Supine Quad Sets: AROM;Both;Supine;10 reps Short Arc QuadBarbaraann Boys;Left;10 reps;Supine Heel Slides: AAROM;10 reps;Supine;Left Straight Leg Raises: AAROM;Supine;Left;AROM;10 reps Long Arc Quad: AROM;Left;5 reps;Seated Knee Flexion: AAROM;Left;5 reps;Seated Goniometric ROM: 5-50* AAROM L knee    General Comments        Pertinent Vitals/Pain Pain Assessment: 0-10 Pain Score: 6  Pain Location: L knee Pain Descriptors / Indicators: Sore Pain Intervention(s): Monitored during session;Premedicated before session;Ice applied;Limited activity within patient's tolerance    Home Living Family/patient expects to be discharged to:: Private residence Living Arrangements: Spouse/significant other Available Help at Discharge: Family;Available 24 hours/day (husband and daughter)         Home Equipment: Gilmer Mor - single point;Shower seat - built in;Hand held shower head      Prior Function Level of Independence: Independent          PT Goals (current goals can now be found in the care plan section) Acute Rehab PT Goals Patient Stated Goal: to go shopping with her daughter PT Goal Formulation: With patient/family Time For Goal Achievement: 10/02/15 Potential to Achieve Goals: Good Progress towards PT goals: Progressing toward goals    Frequency  7X/week    PT Plan Current plan remains appropriate    Co-evaluation             End of Session Equipment Utilized During Treatment: Gait belt Activity Tolerance: Patient tolerated  treatment well Patient left: in chair;with call bell/phone within reach;with chair alarm set;with family/visitor present     Time: 1191-47820840-0913 PT Time Calculation (min) (ACUTE ONLY): 33 min  Charges:  $Gait Training: 8-22 mins $Therapeutic Exercise: 8-22 mins                    G Codes:      Tamala SerUhlenberg, Chang Tiggs Kistler 09/26/2015, 9:23 AM [phone]

## 2015-09-26 NOTE — Progress Notes (Signed)
Patient ID: Kaitlyn NuttingFrances E Calica, female   DOB: 1955-07-09, 60 y.o.   MRN: [id number] Subjective: 1 Day Post-Op Procedure(s) (LRB): LEFT TOTAL KNEE ARTHROPLASTY (Left)    Patient reports pain as moderate but would like to try different pain to see if she can get better control.  Otherwise eager to get through this process and better getting better  Objective:   VITALS:   Filed Vitals:   09/26/15 0220 09/26/15 0633  BP: 111/50 117/57  Pulse: 58 53  Temp: 98 F (36.7 C) 98.2 F (36.8 C)  Resp: 16 16    Neurovascular intact Incision: dressing C/D/I  LABS  Recent Labs  09/26/15 0417  HGB 11.1*  HCT 34.7*  WBC 9.6  PLT 152     Recent Labs  09/26/15 0417  NA 137  K 3.9  BUN 17  CREATININE 0.65  GLUCOSE 127*    No results for input(s): LABPT, INR in the last 72 hours.   Assessment/Plan: 1 Day Post-Op Procedure(s) (LRB): LEFT TOTAL KNEE ARTHROPLASTY (Left)   Advance diet Up with therapy Discharge home with home health today if oxycodone helps to better control post op pain RTC in 2 weeks Reviewed goals (0->90 degrees)

## 2015-09-26 NOTE — Progress Notes (Signed)
CM met with pt in room to offer choice of home health agency.  Pt chooses [facility] to render HHPT.  Referral given to [facility] rep, Tim (on unit).  CM called [facility] DME rep, Melissa to please deliver the 3n1 and rolling walker to room prior to discharge.  No other CM needs were communicated.

## 2015-10-02 NOTE — Discharge Summary (Signed)
Physician Discharge Summary  Patient ID: Kaitlyn Melton MRN: [id number] DOB/AGE: [birth year] 59 y.o.  Admit date: 09/25/2015 Discharge date: 09/26/2015   Procedures:  Procedure(s) (LRB): LEFT TOTAL KNEE ARTHROPLASTY (Left)  Attending Physician:  Dr. Durene Romans   Admission Diagnoses:   Left knee primary OA / pain  Discharge Diagnoses:  Principal Problem:   S/P left TKA Active Problems:   S/P knee replacement  Past Medical History  Diagnosis Date  . Hypertension   . Arthritis   . Heart murmur   . Hypothyroidism     HPI:    Kaitlyn Melton, 60 y.o. female, has a history of pain and functional disability in the left knee due to arthritis and has failed non-surgical conservative treatments for greater than 12 weeks to include NSAID's and/or analgesics, corticosteriod injections, viscosupplementation injections and activity modification. Onset of symptoms was gradual, starting 3+ years ago with gradually worsening course since that time. The patient noted no past surgery on the left knee(s). Patient currently rates pain in the left knee(s) at 10 out of 10 with activity. Patient has night pain, worsening of pain with activity and weight bearing, pain that interferes with activities of daily living, pain with passive range of motion, crepitus and joint swelling. Patient has evidence of periarticular osteophytes and joint space narrowing by imaging studies. There is no active infection. Risks, benefits and expectations were discussed with the patient. Risks including but not limited to the risk of anesthesia, blood clots, nerve damage, blood vessel damage, failure of the prosthesis, infection and up to and including death. Patient understand the risks, benefits and expectations and wishes to proceed with surgery.  PCP: Lucila Maine   Discharged Condition: good  Hospital Course:  Patient underwent the above stated procedure on 09/25/2015. Patient tolerated the  procedure well and brought to the recovery room in good condition and subsequently to the floor.  POD #1 BP: 117/57 ; Pulse: 53 ; Temp: 98.2 F (36.8 C) ; Resp: 16 Patient reports pain as moderate but would like to try different pain to see if she can get better control. Otherwise eager to get through this process and better getting better. Neurovascular intact and incision: dressing C/D/I  LABS  Basename    HGB     11.1  HCT     34.7    Discharge Exam: General appearance: alert, cooperative and no distress Extremities: Homans sign is negative, no sign of DVT, no edema, redness or tenderness in the calves or thighs and no ulcers, gangrene or trophic changes  Disposition: 06-Home-Health Care Svc with follow up in 2 weeks   Follow-up Information    Follow up with Shelda Pal, MD. Schedule an appointment as soon as possible for a visit in 2 weeks.   Specialty:  Orthopedic Surgery   Contact information:   [address] [place] [address] [phone]       Follow up with [facility].   Why:  home health physical therapy   Contact information:   [address] [place] [address] [phone]       Follow up with [facility].   Why:  rolling walker and 3n1 (over the commode seat)   Contact information:   [address] [place] [address] [phone]       Discharge Instructions    Call MD / Call 911    Complete by:  As directed   If  you experience chest pain or shortness of breath, CALL 911 and be transported to the hospital emergency room.  If you develope a fever above 101 F, pus (white drainage) or increased drainage or redness at the wound, or calf pain, call your surgeon's office.     Change dressing    Complete by:  As directed   Maintain surgical dressing until follow up in the clinic. If the edges start to pull up, may reinforce with tape. If the dressing is no longer working, may remove and  cover with gauze and tape, but must keep the area dry and clean.  Call with any questions or concerns.     Constipation Prevention    Complete by:  As directed   Drink plenty of fluids.  Prune juice may be helpful.  You may use a stool softener, such as Colace (over the counter) 100 mg twice a day.  Use MiraLax (over the counter) for constipation as needed.     Diet - low sodium heart healthy    Complete by:  As directed      Discharge instructions    Complete by:  As directed   Maintain surgical dressing until follow up in the clinic. If the edges start to pull up, may reinforce with tape. If the dressing is no longer working, may remove and cover with gauze and tape, but must keep the area dry and clean.  [facility] Call with any questions or concerns.     Increase activity slowly as tolerated    Complete by:  As directed   Weight bearing as tolerated with assist device (walker, cane, etc) as directed, use it as long as suggested by your surgeon or therapist, typically at least 4-6 weeks.     TED hose    Complete by:  As directed   Use stockings (TED hose) for 2 weeks on both leg(s).  You may remove them at night for sleeping.             Medication List    STOP taking these medications        estradiol 0.1 MG/24HR patch  Commonly known as:  VIVELLE-DOT     traMADol 50 MG tablet  Commonly known as:  ULTRAM      TAKE these medications        acetaminophen 500 MG tablet  Commonly known as:  TYLENOL  Take 2 tablets (1,000 mg total) by mouth every 8 (eight) hours.     aspirin 325 MG EC tablet  Take 1 tablet (325 mg total) by mouth 2 (two) times daily.     celecoxib 200 MG capsule  Commonly known as:  CELEBREX  Take 1 capsule (200 mg total) by mouth every 12 (twelve) hours.     docusate sodium 100 MG capsule  Commonly known as:  COLACE  Take 1 capsule (100 mg total) by mouth 2 (two) times daily.     ferrous sulfate 325 (65 FE) MG  tablet  Take 1 tablet (325 mg total) by mouth 3 (three) times daily after meals.     levothyroxine 100 MCG tablet  Commonly known as:  SYNTHROID, LEVOTHROID  Take 100 mcg by mouth daily.     LORazepam 0.5 MG tablet  Commonly known as:  ATIVAN  Take 0.5 mg by mouth at bedtime as needed. sleep     methocarbamol 500 MG tablet  Commonly known as:  ROBAXIN  Take 1 tablet (  500 mg total) by mouth every 6 (six) hours as needed for muscle spasms.     metoprolol succinate 50 MG 24 hr tablet  Commonly known as:  TOPROL-XL  Take 50 mg by mouth daily. Take with or immediately following a meal.     norethindrone 5 MG tablet  Commonly known as:  AYGESTIN  Take 2.5 mg by mouth daily as needed. For hot flashes, takes only when wearing the estradiol patch     oxyCODONE 5 MG immediate release tablet  Commonly known as:  Oxy IR/ROXICODONE  Take 1-3 tablets (5-15 mg total) by mouth every 4 (four) hours as needed for severe pain.     polyethylene glycol packet  Commonly known as:  MIRALAX / GLYCOLAX  Take 17 g by mouth 2 (two) times daily.     VITAMIN D (CHOLECALCIFEROL) PO  Take 1 tablet by mouth daily.         Signed: Anastasio Auerbach. Juliene Kirsh   PA-C  10/02/2015, 9:16 PM

## 2015-12-04 ENCOUNTER — Encounter (HOSPITAL_COMMUNITY): Payer: BLUE CROSS/BLUE SHIELD

## 2015-12-12 ENCOUNTER — Encounter (HOSPITAL_COMMUNITY): Admission: RE | Payer: Self-pay | Source: Ambulatory Visit

## 2015-12-12 ENCOUNTER — Inpatient Hospital Stay (HOSPITAL_COMMUNITY)
Admission: RE | Admit: 2015-12-12 | Payer: BLUE CROSS/BLUE SHIELD | Source: Ambulatory Visit | Admitting: Orthopedic Surgery

## 2015-12-12 SURGERY — ARTHROPLASTY, KNEE, TOTAL
Anesthesia: Spinal | Site: Knee | Laterality: Right

## 2021-08-28 DIAGNOSIS — F330 Major depressive disorder, recurrent, mild: Secondary | ICD-10-CM | POA: Diagnosis not present

## 2021-08-28 DIAGNOSIS — E039 Hypothyroidism, unspecified: Secondary | ICD-10-CM | POA: Diagnosis not present

## 2021-08-28 DIAGNOSIS — I10 Essential (primary) hypertension: Secondary | ICD-10-CM | POA: Diagnosis not present

## 2021-08-28 DIAGNOSIS — C186 Malignant neoplasm of descending colon: Secondary | ICD-10-CM | POA: Diagnosis not present

## 2021-09-03 DIAGNOSIS — Z1231 Encounter for screening mammogram for malignant neoplasm of breast: Secondary | ICD-10-CM | POA: Diagnosis not present

## 2021-09-19 DIAGNOSIS — E2839 Other primary ovarian failure: Secondary | ICD-10-CM | POA: Diagnosis not present

## 2021-09-24 DIAGNOSIS — I10 Essential (primary) hypertension: Secondary | ICD-10-CM | POA: Diagnosis not present

## 2021-09-24 DIAGNOSIS — Z9889 Other specified postprocedural states: Secondary | ICD-10-CM | POA: Diagnosis not present

## 2021-09-24 DIAGNOSIS — C186 Malignant neoplasm of descending colon: Secondary | ICD-10-CM | POA: Diagnosis not present

## 2021-09-26 DIAGNOSIS — Z0000 Encounter for general adult medical examination without abnormal findings: Secondary | ICD-10-CM | POA: Diagnosis not present

## 2021-10-26 DIAGNOSIS — C187 Malignant neoplasm of sigmoid colon: Secondary | ICD-10-CM | POA: Diagnosis not present

## 2021-11-02 DIAGNOSIS — K8020 Calculus of gallbladder without cholecystitis without obstruction: Secondary | ICD-10-CM | POA: Diagnosis not present

## 2021-11-02 DIAGNOSIS — C187 Malignant neoplasm of sigmoid colon: Secondary | ICD-10-CM | POA: Diagnosis not present

## 2021-11-02 DIAGNOSIS — I700 Atherosclerosis of aorta: Secondary | ICD-10-CM | POA: Diagnosis not present

## 2021-11-02 DIAGNOSIS — R918 Other nonspecific abnormal finding of lung field: Secondary | ICD-10-CM | POA: Diagnosis not present

## 2021-11-08 DIAGNOSIS — K769 Liver disease, unspecified: Secondary | ICD-10-CM | POA: Diagnosis not present

## 2021-11-08 DIAGNOSIS — Z85038 Personal history of other malignant neoplasm of large intestine: Secondary | ICD-10-CM | POA: Diagnosis not present

## 2021-11-08 DIAGNOSIS — K8020 Calculus of gallbladder without cholecystitis without obstruction: Secondary | ICD-10-CM | POA: Diagnosis not present

## 2021-11-08 DIAGNOSIS — E042 Nontoxic multinodular goiter: Secondary | ICD-10-CM | POA: Diagnosis not present

## 2021-11-08 DIAGNOSIS — K7689 Other specified diseases of liver: Secondary | ICD-10-CM | POA: Diagnosis not present

## 2021-11-20 DIAGNOSIS — Z85038 Personal history of other malignant neoplasm of large intestine: Secondary | ICD-10-CM | POA: Diagnosis not present

## 2021-11-20 DIAGNOSIS — K769 Liver disease, unspecified: Secondary | ICD-10-CM | POA: Diagnosis not present

## 2021-11-20 DIAGNOSIS — I700 Atherosclerosis of aorta: Secondary | ICD-10-CM | POA: Diagnosis not present

## 2021-11-20 DIAGNOSIS — K8020 Calculus of gallbladder without cholecystitis without obstruction: Secondary | ICD-10-CM | POA: Diagnosis not present

## 2021-11-20 DIAGNOSIS — C187 Malignant neoplasm of sigmoid colon: Secondary | ICD-10-CM | POA: Diagnosis not present

## 2021-11-21 DIAGNOSIS — C187 Malignant neoplasm of sigmoid colon: Secondary | ICD-10-CM | POA: Diagnosis not present

## 2021-11-21 DIAGNOSIS — K769 Liver disease, unspecified: Secondary | ICD-10-CM | POA: Diagnosis not present

## 2021-11-21 DIAGNOSIS — D649 Anemia, unspecified: Secondary | ICD-10-CM | POA: Diagnosis not present

## 2021-11-21 DIAGNOSIS — Z85038 Personal history of other malignant neoplasm of large intestine: Secondary | ICD-10-CM | POA: Diagnosis not present

## 2021-11-29 DIAGNOSIS — I10 Essential (primary) hypertension: Secondary | ICD-10-CM | POA: Diagnosis not present

## 2021-11-29 DIAGNOSIS — Z1321 Encounter for screening for nutritional disorder: Secondary | ICD-10-CM | POA: Diagnosis not present

## 2021-11-29 DIAGNOSIS — Z23 Encounter for immunization: Secondary | ICD-10-CM | POA: Diagnosis not present

## 2021-11-29 DIAGNOSIS — C186 Malignant neoplasm of descending colon: Secondary | ICD-10-CM | POA: Diagnosis not present

## 2021-11-29 DIAGNOSIS — C799 Secondary malignant neoplasm of unspecified site: Secondary | ICD-10-CM | POA: Diagnosis not present

## 2021-11-29 DIAGNOSIS — E039 Hypothyroidism, unspecified: Secondary | ICD-10-CM | POA: Diagnosis not present

## 2021-11-29 DIAGNOSIS — E782 Mixed hyperlipidemia: Secondary | ICD-10-CM | POA: Diagnosis not present

## 2021-12-03 DIAGNOSIS — C186 Malignant neoplasm of descending colon: Secondary | ICD-10-CM | POA: Diagnosis not present

## 2021-12-03 DIAGNOSIS — C787 Secondary malignant neoplasm of liver and intrahepatic bile duct: Secondary | ICD-10-CM | POA: Diagnosis not present

## 2021-12-03 DIAGNOSIS — R160 Hepatomegaly, not elsewhere classified: Secondary | ICD-10-CM | POA: Diagnosis not present

## 2021-12-03 DIAGNOSIS — I10 Essential (primary) hypertension: Secondary | ICD-10-CM | POA: Diagnosis not present

## 2021-12-03 DIAGNOSIS — Z85038 Personal history of other malignant neoplasm of large intestine: Secondary | ICD-10-CM | POA: Diagnosis not present

## 2021-12-03 DIAGNOSIS — C189 Malignant neoplasm of colon, unspecified: Secondary | ICD-10-CM | POA: Diagnosis not present

## 2021-12-03 DIAGNOSIS — K769 Liver disease, unspecified: Secondary | ICD-10-CM | POA: Diagnosis not present

## 2021-12-05 DIAGNOSIS — Z01818 Encounter for other preprocedural examination: Secondary | ICD-10-CM | POA: Diagnosis not present

## 2021-12-05 DIAGNOSIS — Z01810 Encounter for preprocedural cardiovascular examination: Secondary | ICD-10-CM | POA: Diagnosis not present

## 2021-12-05 DIAGNOSIS — C787 Secondary malignant neoplasm of liver and intrahepatic bile duct: Secondary | ICD-10-CM | POA: Diagnosis not present

## 2021-12-05 DIAGNOSIS — I5181 Takotsubo syndrome: Secondary | ICD-10-CM | POA: Diagnosis not present

## 2021-12-05 DIAGNOSIS — C189 Malignant neoplasm of colon, unspecified: Secondary | ICD-10-CM | POA: Diagnosis not present

## 2021-12-05 DIAGNOSIS — Z01812 Encounter for preprocedural laboratory examination: Secondary | ICD-10-CM | POA: Diagnosis not present

## 2021-12-05 DIAGNOSIS — R160 Hepatomegaly, not elsewhere classified: Secondary | ICD-10-CM | POA: Diagnosis not present

## 2021-12-05 DIAGNOSIS — I25119 Atherosclerotic heart disease of native coronary artery with unspecified angina pectoris: Secondary | ICD-10-CM | POA: Diagnosis not present

## 2021-12-11 DIAGNOSIS — E041 Nontoxic single thyroid nodule: Secondary | ICD-10-CM | POA: Diagnosis not present

## 2021-12-13 DIAGNOSIS — R040 Epistaxis: Secondary | ICD-10-CM | POA: Diagnosis not present

## 2021-12-13 DIAGNOSIS — I2510 Atherosclerotic heart disease of native coronary artery without angina pectoris: Secondary | ICD-10-CM | POA: Diagnosis not present

## 2021-12-13 DIAGNOSIS — Z8249 Family history of ischemic heart disease and other diseases of the circulatory system: Secondary | ICD-10-CM | POA: Diagnosis not present

## 2021-12-13 DIAGNOSIS — C787 Secondary malignant neoplasm of liver and intrahepatic bile duct: Secondary | ICD-10-CM | POA: Diagnosis not present

## 2021-12-13 DIAGNOSIS — Z96653 Presence of artificial knee joint, bilateral: Secondary | ICD-10-CM | POA: Diagnosis not present

## 2021-12-13 DIAGNOSIS — C189 Malignant neoplasm of colon, unspecified: Secondary | ICD-10-CM | POA: Diagnosis not present

## 2021-12-13 DIAGNOSIS — Z79899 Other long term (current) drug therapy: Secondary | ICD-10-CM | POA: Diagnosis not present

## 2021-12-13 DIAGNOSIS — I5181 Takotsubo syndrome: Secondary | ICD-10-CM | POA: Diagnosis not present

## 2021-12-13 DIAGNOSIS — Z8659 Personal history of other mental and behavioral disorders: Secondary | ICD-10-CM | POA: Diagnosis not present

## 2021-12-13 DIAGNOSIS — Z801 Family history of malignant neoplasm of trachea, bronchus and lung: Secondary | ICD-10-CM | POA: Diagnosis not present

## 2021-12-13 DIAGNOSIS — Z83438 Family history of other disorder of lipoprotein metabolism and other lipidemia: Secondary | ICD-10-CM | POA: Diagnosis not present

## 2021-12-13 DIAGNOSIS — I10 Essential (primary) hypertension: Secondary | ICD-10-CM | POA: Diagnosis not present

## 2021-12-13 DIAGNOSIS — Z7982 Long term (current) use of aspirin: Secondary | ICD-10-CM | POA: Diagnosis not present

## 2021-12-13 DIAGNOSIS — E039 Hypothyroidism, unspecified: Secondary | ICD-10-CM | POA: Diagnosis not present

## 2021-12-13 DIAGNOSIS — I252 Old myocardial infarction: Secondary | ICD-10-CM | POA: Diagnosis not present

## 2021-12-13 DIAGNOSIS — Z9049 Acquired absence of other specified parts of digestive tract: Secondary | ICD-10-CM | POA: Diagnosis not present

## 2021-12-18 DIAGNOSIS — D62 Acute posthemorrhagic anemia: Secondary | ICD-10-CM | POA: Diagnosis not present

## 2021-12-18 DIAGNOSIS — R7989 Other specified abnormal findings of blood chemistry: Secondary | ICD-10-CM | POA: Diagnosis not present

## 2021-12-18 DIAGNOSIS — I252 Old myocardial infarction: Secondary | ICD-10-CM | POA: Diagnosis not present

## 2021-12-18 DIAGNOSIS — R040 Epistaxis: Secondary | ICD-10-CM | POA: Diagnosis not present

## 2021-12-18 DIAGNOSIS — I25119 Atherosclerotic heart disease of native coronary artery with unspecified angina pectoris: Secondary | ICD-10-CM | POA: Diagnosis not present

## 2021-12-18 DIAGNOSIS — I10 Essential (primary) hypertension: Secondary | ICD-10-CM | POA: Diagnosis not present

## 2021-12-19 DIAGNOSIS — J329 Chronic sinusitis, unspecified: Secondary | ICD-10-CM | POA: Diagnosis not present

## 2021-12-19 DIAGNOSIS — I10 Essential (primary) hypertension: Secondary | ICD-10-CM | POA: Diagnosis not present

## 2021-12-19 DIAGNOSIS — D62 Acute posthemorrhagic anemia: Secondary | ICD-10-CM | POA: Diagnosis not present

## 2021-12-19 DIAGNOSIS — R519 Headache, unspecified: Secondary | ICD-10-CM | POA: Diagnosis not present

## 2021-12-19 DIAGNOSIS — R040 Epistaxis: Secondary | ICD-10-CM | POA: Diagnosis not present

## 2021-12-20 DIAGNOSIS — R040 Epistaxis: Secondary | ICD-10-CM | POA: Diagnosis not present

## 2021-12-20 DIAGNOSIS — I10 Essential (primary) hypertension: Secondary | ICD-10-CM | POA: Diagnosis not present

## 2021-12-20 DIAGNOSIS — D62 Acute posthemorrhagic anemia: Secondary | ICD-10-CM | POA: Diagnosis not present

## 2021-12-21 DIAGNOSIS — R040 Epistaxis: Secondary | ICD-10-CM | POA: Diagnosis not present

## 2021-12-24 DIAGNOSIS — E042 Nontoxic multinodular goiter: Secondary | ICD-10-CM | POA: Diagnosis not present

## 2021-12-25 DIAGNOSIS — D649 Anemia, unspecified: Secondary | ICD-10-CM | POA: Diagnosis not present

## 2021-12-25 DIAGNOSIS — Z09 Encounter for follow-up examination after completed treatment for conditions other than malignant neoplasm: Secondary | ICD-10-CM | POA: Diagnosis not present

## 2021-12-25 DIAGNOSIS — R040 Epistaxis: Secondary | ICD-10-CM | POA: Diagnosis not present

## 2021-12-28 DIAGNOSIS — C189 Malignant neoplasm of colon, unspecified: Secondary | ICD-10-CM | POA: Diagnosis not present

## 2021-12-28 DIAGNOSIS — C787 Secondary malignant neoplasm of liver and intrahepatic bile duct: Secondary | ICD-10-CM | POA: Diagnosis not present

## 2021-12-28 DIAGNOSIS — D62 Acute posthemorrhagic anemia: Secondary | ICD-10-CM | POA: Diagnosis not present

## 2021-12-31 DIAGNOSIS — C787 Secondary malignant neoplasm of liver and intrahepatic bile duct: Secondary | ICD-10-CM | POA: Diagnosis not present

## 2021-12-31 DIAGNOSIS — C186 Malignant neoplasm of descending colon: Secondary | ICD-10-CM | POA: Diagnosis not present

## 2021-12-31 DIAGNOSIS — C189 Malignant neoplasm of colon, unspecified: Secondary | ICD-10-CM | POA: Diagnosis not present

## 2021-12-31 DIAGNOSIS — Z9889 Other specified postprocedural states: Secondary | ICD-10-CM | POA: Diagnosis not present

## 2021-12-31 DIAGNOSIS — I10 Essential (primary) hypertension: Secondary | ICD-10-CM | POA: Diagnosis not present

## 2022-01-03 DIAGNOSIS — C787 Secondary malignant neoplasm of liver and intrahepatic bile duct: Secondary | ICD-10-CM | POA: Diagnosis not present

## 2022-01-03 DIAGNOSIS — C189 Malignant neoplasm of colon, unspecified: Secondary | ICD-10-CM | POA: Diagnosis not present

## 2022-01-09 DIAGNOSIS — C189 Malignant neoplasm of colon, unspecified: Secondary | ICD-10-CM | POA: Diagnosis not present

## 2022-01-09 DIAGNOSIS — C787 Secondary malignant neoplasm of liver and intrahepatic bile duct: Secondary | ICD-10-CM | POA: Diagnosis not present

## 2022-01-10 DIAGNOSIS — C189 Malignant neoplasm of colon, unspecified: Secondary | ICD-10-CM | POA: Diagnosis not present

## 2022-01-10 DIAGNOSIS — C787 Secondary malignant neoplasm of liver and intrahepatic bile duct: Secondary | ICD-10-CM | POA: Diagnosis not present

## 2022-01-11 DIAGNOSIS — C787 Secondary malignant neoplasm of liver and intrahepatic bile duct: Secondary | ICD-10-CM | POA: Diagnosis not present

## 2022-01-11 DIAGNOSIS — C189 Malignant neoplasm of colon, unspecified: Secondary | ICD-10-CM | POA: Diagnosis not present

## 2022-01-14 DIAGNOSIS — D62 Acute posthemorrhagic anemia: Secondary | ICD-10-CM | POA: Diagnosis not present

## 2022-01-14 DIAGNOSIS — C787 Secondary malignant neoplasm of liver and intrahepatic bile duct: Secondary | ICD-10-CM | POA: Diagnosis not present

## 2022-01-14 DIAGNOSIS — Z5111 Encounter for antineoplastic chemotherapy: Secondary | ICD-10-CM | POA: Diagnosis not present

## 2022-01-14 DIAGNOSIS — C189 Malignant neoplasm of colon, unspecified: Secondary | ICD-10-CM | POA: Diagnosis not present

## 2022-01-14 DIAGNOSIS — D509 Iron deficiency anemia, unspecified: Secondary | ICD-10-CM | POA: Diagnosis not present

## 2022-01-16 DIAGNOSIS — C187 Malignant neoplasm of sigmoid colon: Secondary | ICD-10-CM | POA: Diagnosis not present

## 2022-01-16 DIAGNOSIS — K625 Hemorrhage of anus and rectum: Secondary | ICD-10-CM | POA: Diagnosis not present

## 2022-01-22 DIAGNOSIS — C186 Malignant neoplasm of descending colon: Secondary | ICD-10-CM | POA: Diagnosis not present

## 2022-01-22 DIAGNOSIS — C187 Malignant neoplasm of sigmoid colon: Secondary | ICD-10-CM | POA: Diagnosis not present

## 2022-01-22 DIAGNOSIS — I10 Essential (primary) hypertension: Secondary | ICD-10-CM | POA: Diagnosis not present

## 2022-01-28 DIAGNOSIS — C189 Malignant neoplasm of colon, unspecified: Secondary | ICD-10-CM | POA: Diagnosis not present

## 2022-01-28 DIAGNOSIS — Z5111 Encounter for antineoplastic chemotherapy: Secondary | ICD-10-CM | POA: Diagnosis not present

## 2022-01-28 DIAGNOSIS — C787 Secondary malignant neoplasm of liver and intrahepatic bile duct: Secondary | ICD-10-CM | POA: Diagnosis not present

## 2022-01-30 DIAGNOSIS — C187 Malignant neoplasm of sigmoid colon: Secondary | ICD-10-CM | POA: Diagnosis not present

## 2022-01-30 DIAGNOSIS — K625 Hemorrhage of anus and rectum: Secondary | ICD-10-CM | POA: Diagnosis not present

## 2022-02-11 DIAGNOSIS — C787 Secondary malignant neoplasm of liver and intrahepatic bile duct: Secondary | ICD-10-CM | POA: Diagnosis not present

## 2022-02-11 DIAGNOSIS — D696 Thrombocytopenia, unspecified: Secondary | ICD-10-CM | POA: Diagnosis not present

## 2022-02-11 DIAGNOSIS — I25119 Atherosclerotic heart disease of native coronary artery with unspecified angina pectoris: Secondary | ICD-10-CM | POA: Diagnosis not present

## 2022-02-11 DIAGNOSIS — R634 Abnormal weight loss: Secondary | ICD-10-CM | POA: Diagnosis not present

## 2022-02-11 DIAGNOSIS — G620 Drug-induced polyneuropathy: Secondary | ICD-10-CM | POA: Diagnosis not present

## 2022-02-11 DIAGNOSIS — C189 Malignant neoplasm of colon, unspecified: Secondary | ICD-10-CM | POA: Diagnosis not present

## 2022-02-11 DIAGNOSIS — R630 Anorexia: Secondary | ICD-10-CM | POA: Diagnosis not present

## 2022-02-11 DIAGNOSIS — R079 Chest pain, unspecified: Secondary | ICD-10-CM | POA: Diagnosis not present

## 2022-02-11 DIAGNOSIS — T451X5A Adverse effect of antineoplastic and immunosuppressive drugs, initial encounter: Secondary | ICD-10-CM | POA: Diagnosis not present

## 2022-02-11 DIAGNOSIS — D6481 Anemia due to antineoplastic chemotherapy: Secondary | ICD-10-CM | POA: Diagnosis not present

## 2022-02-11 DIAGNOSIS — D701 Agranulocytosis secondary to cancer chemotherapy: Secondary | ICD-10-CM | POA: Diagnosis not present

## 2022-02-11 DIAGNOSIS — R110 Nausea: Secondary | ICD-10-CM | POA: Diagnosis not present

## 2022-02-11 DIAGNOSIS — R5383 Other fatigue: Secondary | ICD-10-CM | POA: Diagnosis not present

## 2022-02-26 DIAGNOSIS — Z9889 Other specified postprocedural states: Secondary | ICD-10-CM | POA: Diagnosis not present

## 2022-02-26 DIAGNOSIS — I10 Essential (primary) hypertension: Secondary | ICD-10-CM | POA: Diagnosis not present

## 2022-02-26 DIAGNOSIS — I2510 Atherosclerotic heart disease of native coronary artery without angina pectoris: Secondary | ICD-10-CM | POA: Diagnosis not present

## 2022-02-26 DIAGNOSIS — C787 Secondary malignant neoplasm of liver and intrahepatic bile duct: Secondary | ICD-10-CM | POA: Diagnosis not present

## 2022-02-26 DIAGNOSIS — I059 Rheumatic mitral valve disease, unspecified: Secondary | ICD-10-CM | POA: Diagnosis not present

## 2022-02-26 DIAGNOSIS — E039 Hypothyroidism, unspecified: Secondary | ICD-10-CM | POA: Diagnosis not present

## 2022-02-26 DIAGNOSIS — C189 Malignant neoplasm of colon, unspecified: Secondary | ICD-10-CM | POA: Diagnosis not present

## 2022-02-26 DIAGNOSIS — R079 Chest pain, unspecified: Secondary | ICD-10-CM | POA: Diagnosis not present

## 2022-02-26 DIAGNOSIS — C186 Malignant neoplasm of descending colon: Secondary | ICD-10-CM | POA: Diagnosis not present
